# Patient Record
Sex: Male | Born: 1995 | Race: White | Hispanic: No | Marital: Single | Smoking: Former smoker
Health system: Southern US, Community
[De-identification: ages and names within clinical notes are randomized; demographics above are authoritative.]

## PROBLEM LIST (undated history)

## (undated) DIAGNOSIS — Z21 Asymptomatic human immunodeficiency virus [HIV] infection status: Secondary | ICD-10-CM

## (undated) DIAGNOSIS — F419 Anxiety disorder, unspecified: Secondary | ICD-10-CM

## (undated) DIAGNOSIS — B20 Human immunodeficiency virus [HIV] disease: Secondary | ICD-10-CM

## (undated) DIAGNOSIS — J45909 Unspecified asthma, uncomplicated: Secondary | ICD-10-CM

## (undated) DIAGNOSIS — K589 Irritable bowel syndrome without diarrhea: Secondary | ICD-10-CM

---
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
## 2016-04-16 ENCOUNTER — Emergency Department
Admission: EM | Admit: 2016-04-16 | Discharge: 2016-04-16 | Disposition: A | Discharge status: Home / Self Care | Payer: Self-pay | Attending: Emergency Medicine | Admitting: Emergency Medicine

## 2016-04-16 ENCOUNTER — Emergency Department: Payer: Self-pay

## 2016-04-16 DIAGNOSIS — Z87891 Personal history of nicotine dependence: Secondary | ICD-10-CM | POA: Insufficient documentation

## 2016-04-16 DIAGNOSIS — J45909 Unspecified asthma, uncomplicated: Secondary | ICD-10-CM | POA: Insufficient documentation

## 2016-04-16 DIAGNOSIS — Z79899 Other long term (current) drug therapy: Secondary | ICD-10-CM | POA: Insufficient documentation

## 2016-04-16 DIAGNOSIS — R109 Unspecified abdominal pain: Secondary | ICD-10-CM

## 2016-04-16 DIAGNOSIS — K529 Noninfective gastroenteritis and colitis, unspecified: Secondary | ICD-10-CM | POA: Insufficient documentation

## 2016-04-16 DIAGNOSIS — R112 Nausea with vomiting, unspecified: Secondary | ICD-10-CM

## 2016-04-16 LAB — COMPREHENSIVE METABOLIC PANEL
ALBUMIN: 4.4 g/dL (ref 3.5–5.0)
ALK PHOS: 59 U/L (ref 38–126)
ALT: 10 U/L — AB (ref 17–63)
AST: 13 U/L — AB (ref 15–41)
Anion gap: 8 (ref 5–15)
BILIRUBIN TOTAL: 0.7 mg/dL (ref 0.3–1.2)
BUN: 17 mg/dL (ref 6–20)
CALCIUM: 9.1 mg/dL (ref 8.9–10.3)
CO2: 25 mmol/L (ref 22–32)
CREATININE: 0.79 mg/dL (ref 0.61–1.24)
Chloride: 104 mmol/L (ref 101–111)
GFR calc Af Amer: >60 mL/min (ref >60)
GFR calc non Af Amer: >60 mL/min (ref >60)
GLUCOSE: 110 mg/dL — AB (ref 65–99)
POTASSIUM: 3.7 mmol/L (ref 3.5–5.1)
Sodium: 137 mmol/L (ref 135–145)
TOTAL PROTEIN: 8 g/dL (ref 6.5–8.1)

## 2016-04-16 LAB — CBC
HEMATOCRIT: 43.6 % (ref 40.0–52.0)
Hemoglobin: 14.3 g/dL (ref 13.0–18.0)
MCH: 28.2 pg (ref 26.0–34.0)
MCHC: 32.8 g/dL (ref 32.0–36.0)
MCV: 85.8 fL (ref 80.0–100.0)
PLATELETS: 187 10*3/uL (ref 150–440)
RBC: 5.09 MIL/uL (ref 4.40–5.90)
RDW: 12.7 % (ref 11.5–14.5)
WBC: 12.3 10*3/uL — ABNORMAL HIGH (ref 3.8–10.6)

## 2016-04-16 LAB — URINALYSIS, COMPLETE (UACMP) WITH MICROSCOPIC
BACTERIA UA: NONE SEEN
Bilirubin Urine: NEGATIVE
Glucose, UA: NEGATIVE mg/dL
HGB URINE DIPSTICK: NEGATIVE
Ketones, ur: 80 mg/dL — AB
Leukocytes, UA: NEGATIVE
NITRITE: NEGATIVE
PH: 6 (ref 5.0–8.0)
Protein, ur: NEGATIVE mg/dL
RBC / HPF: NONE SEEN RBC/hpf (ref 0–5)
SPECIFIC GRAVITY, URINE: 1.026 (ref 1.005–1.030)
SQUAMOUS EPITHELIAL / LPF: NONE SEEN

## 2016-04-16 LAB — LIPASE, BLOOD: Lipase: 30 U/L (ref 11–51)

## 2016-04-16 MED ORDER — CIPROFLOXACIN HCL 500 MG PO TABS: 500.0000 mg | ORAL_TABLET | Freq: Two times a day (BID) | ORAL | 0 refills | Status: AC

## 2016-04-16 MED ORDER — IOPAMIDOL (ISOVUE-300) INJECTION 61%
15.0000 mL | INTRAVENOUS | Status: AC
  Administered 2016-04-16 (×2): 15 mL via ORAL
  Filled 2016-04-16 (×2): qty 15

## 2016-04-16 MED ORDER — HYDROCODONE-ACETAMINOPHEN 5-325 MG PO TABS: 1.0000 | ORAL_TABLET | ORAL | 0 refills | Status: DC | PRN

## 2016-04-16 MED ORDER — CIPROFLOXACIN HCL 500 MG PO TABS
ORAL_TABLET | ORAL | Status: AC
  Administered 2016-04-16: 500 mg via ORAL
  Filled 2016-04-16: qty 1

## 2016-04-16 MED ORDER — METRONIDAZOLE 500 MG PO TABS
500.0000 mg | ORAL_TABLET | Freq: Once | ORAL | Status: AC
  Administered 2016-04-16: 500 mg via ORAL

## 2016-04-16 MED ORDER — METRONIDAZOLE 500 MG PO TABS
ORAL_TABLET | ORAL | Status: AC
  Filled 2016-04-16: qty 1

## 2016-04-16 MED ORDER — METRONIDAZOLE 500 MG PO TABS: 500.0000 mg | ORAL_TABLET | Freq: Three times a day (TID) | ORAL | 0 refills | Status: AC

## 2016-04-16 MED ORDER — ONDANSETRON HCL 4 MG/2ML IJ SOLN
4.0000 mg | INTRAMUSCULAR | Status: AC
  Administered 2016-04-16: 4 mg via INTRAVENOUS
  Filled 2016-04-16: qty 2

## 2016-04-16 MED ORDER — ONDANSETRON 4 MG PO TBDP: ORAL_TABLET | ORAL | 0 refills | Status: DC

## 2016-04-16 MED ORDER — IOPAMIDOL (ISOVUE-300) INJECTION 61%
75.0000 mL | Freq: Once | INTRAVENOUS | Status: AC | PRN
  Administered 2016-04-16: 75 mL via INTRAVENOUS
  Filled 2016-04-16: qty 75

## 2016-04-16 MED ORDER — MORPHINE SULFATE (PF) 4 MG/ML IV SOLN
4.0000 mg | Freq: Once | INTRAVENOUS | Status: AC
  Administered 2016-04-16: 4 mg via INTRAVENOUS
  Filled 2016-04-16: qty 1

## 2016-04-16 MED ORDER — CIPROFLOXACIN HCL 500 MG PO TABS
500.0000 mg | ORAL_TABLET | ORAL | Status: AC
  Administered 2016-04-16: 500 mg via ORAL

## 2016-04-16 NOTE — ED Notes (Signed)
Given labeled specimen cup. Pt attempted to provide urine sample, unable to do so at this time.

## 2016-04-16 NOTE — ED Provider Notes (Signed)
[facility] Emergency Department Provider Note  ____________________________________________   First MD Initiated Contact with Patient 04/16/16 1723     (approximate)  I have reviewed the triage vital signs and the nursing notes.   HISTORY  Chief Complaint Abdominal Pain and Emesis    HPI Collin Jones is a 21 y.o. male with no significant reported PMH and no surgical history who presents for evaluation of acute onset abd pain w/ multiple episodes of emesis.  Symptoms were severe earlier, "worst pain of my life," but the abd pain is now betterand the nausea has improved.  Reports he has been ill (about a week ago) with flu-like symptoms, but this feels completely different.  He denies chest pain, SOB, diarrhea.  Subjective fever and chills today.  Denies congestion, cough, runny nose.  Pain and nausea are severe, worse with movement, improved slightly on their own.   Past Medical History:  Diagnosis Date  . Asthma     There are no active problems to display for this patient.   History reviewed. No pertinent surgical history.  Prior to Admission medications   Medication Sig Start Date End Date Taking? Authorizing Provider  albuterol (PROVENTIL HFA;VENTOLIN HFA) 108 (90 BASE) MCG/ACT inhaler Inhale 2 puffs into the lungs every 4 (four) hours as needed for wheezing or shortness of breath. 12/24/14   Irean Hong, MD  chlorpheniramine-HYDROcodone (TUSSIONEX PENNKINETIC ER) 10-8 MG/5ML SUER Take 5 mLs by mouth 2 (two) times daily. 12/24/14   Irean Hong, MD  ciprofloxacin (CIPRO) 500 MG tablet Take 1 tablet (500 mg total) by mouth 2 (two) times daily. 04/16/16 04/26/16  Loleta Rose, MD  HYDROcodone-acetaminophen (NORCO/VICODIN) 5-325 MG tablet Take 1-2 tablets by mouth every 4 (four) hours as needed for moderate pain. 04/16/16   Loleta Rose, MD  metroNIDAZOLE (FLAGYL) 500 MG tablet Take 1 tablet (500 mg total) by mouth 3 (three) times daily. 04/16/16 04/26/16   Loleta Rose, MD  ondansetron (ZOFRAN ODT) 4 MG disintegrating tablet Allow 1-2 tablets to dissolve in your mouth every 8 hours as needed for nausea/vomiting 04/16/16   Loleta Rose, MD  predniSONE (DELTASONE) 20 MG tablet 3 tablets daily 4 days 12/24/14   Irean Hong, MD    Allergies Patient has no known allergies.  No family history on file.  Social History Social History  Substance Use Topics  . Smoking status: Former Smoker    Types: Cigarettes  . Smokeless tobacco: Never Used  . Alcohol use No     Comment: rarely    Review of Systems Constitutional: Subjective fever/chills Eyes: No visual changes. ENT: No sore throat. Cardiovascular: Denies chest pain. Respiratory: Denies shortness of breath. Gastrointestinal: Generalized abdominal pain w/ multiple episodes of emesis Genitourinary: Negative for dysuria. Musculoskeletal: Negative for back pain. Skin: Negative for rash. Neurological: Negative for headaches, focal weakness or numbness.  10-point ROS otherwise negative.  ____________________________________________   PHYSICAL EXAM:  VITAL SIGNS: ED Triage Vitals [04/16/16 1401]  Enc Vitals Group     BP 123/65     Pulse Rate 79     Resp 18     Temp 98 F (36.7 C)     Temp Source Oral     SpO2 100 %     Weight 125 lb (56.7 kg)     Height      Head Circumference      Peak Flow      Pain Score 6     Pain Loc  Pain Edu?      Excl. in GC?     Constitutional: Alert and oriented. NAD but appears uncomfortable Eyes: Conjunctivae are normal. PERRL. EOMI. Head: Atraumatic. Nose: No congestion/rhinnorhea. Mouth/Throat: Mucous membranes are moist.  Oropharynx non-erythematous. Neck: No stridor.  No meningeal signs.   Cardiovascular: Normal rate, regular rhythm. Good peripheral circulation. Grossly normal heart sounds. Respiratory: Normal respiratory effort.  No retractions. Lungs CTAB. Gastrointestinal: Soft with localized tenderness to palpation of the RLQ  at McBurney's point with rebound and guarding Musculoskeletal: No lower extremity tenderness nor edema. No gross deformities of extremities. Neurologic:  Normal speech and language. No gross focal neurologic deficits are appreciated.  Skin:  Skin is warm, dry and intact. No rash noted. Psychiatric: Mood and affect are normal. Speech and behavior are normal.  ____________________________________________   LABS (all labs ordered are listed, but only abnormal results are displayed)  Labs Reviewed  COMPREHENSIVE METABOLIC PANEL - Abnormal; Notable for the following:       Result Value   Glucose, Bld 110 (*)    AST 13 (*)    ALT 10 (*)    All other components within normal limits  CBC - Abnormal; Notable for the following:    WBC 12.3 (*)    All other components within normal limits  URINALYSIS, COMPLETE (UACMP) WITH MICROSCOPIC - Abnormal; Notable for the following:    Color, Urine YELLOW (*)    APPearance CLEAR (*)    Ketones, ur 80 (*)    All other components within normal limits  LIPASE, BLOOD   ____________________________________________  EKG  None - EKG not ordered by ED physician ____________________________________________  RADIOLOGY   Ct Abdomen Pelvis W Contrast  Result Date: 04/16/2016 CLINICAL DATA:  Initial evaluation for acute right lower quadrant abdominal pain. EXAM: CT ABDOMEN AND PELVIS WITH CONTRAST TECHNIQUE: Multidetector CT imaging of the abdomen and pelvis was performed using the standard protocol following bolus administration of intravenous contrast. CONTRAST:  75mL ISOVUE-300 IOPAMIDOL (ISOVUE-300) INJECTION 61% COMPARISON:  None. FINDINGS: Lower chest: Few tiny foci of ground-glass opacity within the lingula and posterior left lower lobe, favored to reflect subsegmental atelectasis. Visualized lung bases are otherwise clear. Hepatobiliary: Liver demonstrates a normal contrast enhanced appearance. Gallbladder within normal limits. No biliary dilatation.  Pancreas: Pancreas within normal limits. Spleen: Spleen within normal limits. Adrenals/Urinary Tract: Adrenal glands are normal. Kidneys equal in size with symmetric enhancement. No nephrolithiasis, hydronephrosis, or focal enhancing renal mass. No hydroureter. Bladder within normal limits. Stomach/Bowel: Stomach mildly distended with oral contrast material within the gastric lumen. Stomach otherwise unremarkable. No evidence for bowel obstruction. Appendix visualized within the right lower quadrant and is of normal caliber and appearance associated inflammatory changes to suggest acute appendicitis. No acute inflammatory changes about the adjacent terminal ileum. Colon diffusely decompressed with associated mild circumferential wall thickening. While this is favored to be related incomplete distension, possible mild acute colitis not entirely excluded. No other acute abnormality identified about the bowels. Vascular/Lymphatic: Normal intravascular enhancement seen throughout the intra-abdominal aorta and its branch vessels. No pathologically enlarged intra-abdominal or pelvic lymph nodes identified. Reproductive: Prostate normal. Other: No free air or fluid. Musculoskeletal: No acute osseous abnormality. No worrisome lytic or blastic osseous lesions. IMPRESSION: 1. Diffusely the compression of the descending and sigmoid colon within the left abdomen with associated circumferential wall thickening. While this wall thickening is favored to be related to incomplete distension, possible mild acute colitis is not entirely excluded, and could be considered in the  correct clinical setting. 2. No other acute intra-abdominal or pelvic process identified. 3. Normal appendix. Electronically Signed   By: Rise Mu M.D.   On: 04/16/2016 19:38    ____________________________________________   PROCEDURES  Procedure(s) performed:   Procedures   Critical Care performed:  No ____________________________________________   INITIAL IMPRESSION / ASSESSMENT AND PLAN / ED COURSE  Pertinent labs & imaging results that were available during my care of the patient were reviewed by me and considered in my medical decision making (see chart for details).  Initially I thought that the patient is just suffering from a viral illness which still may be the case.  However on physical exam he has very specific tenderness to palpation of the right lower quadrant with rebound and guarding.  His labs are reassuring as are his vital signs but he and his companion report that he is in the worst pain of his life which is now better.  This is concerning for the possibility of a ruptured appendicitis.  I will evaluate with a CT scan of the abdomen and pelvis.  I am giving morphine and Zofran while he awaits his study.   Clinical Course as of Apr 16 2100  Wed Apr 16, 2016  2050 The patient states he feels quite a bit better.  He has not vomited and is tolerating good by mouth intake.  His CT scan is suggestive of colitis.  I discussed with him that we will treat empirically with Cipro and Flagyl but that he needs to follow up with a GI doctor to determine if additional evaluation is needed to make sure this is not IBD.  I gave my usual customary return precautions and he understands and agrees with the plan.  [CF]    Clinical Course User Index [CF] Loleta Rose, MD    ____________________________________________  FINAL CLINICAL IMPRESSION(S) / ED DIAGNOSES  Final diagnoses:  Colitis  Abdominal pain, unspecified abdominal location  Nausea and vomiting, intractability of vomiting not specified, unspecified vomiting type     MEDICATIONS GIVEN DURING THIS VISIT:  Medications  morphine 4 MG/ML injection 4 mg (4 mg Intravenous Given 04/16/16 1744)  ondansetron (ZOFRAN) injection 4 mg (4 mg Intravenous Given 04/16/16 1744)  iopamidol (ISOVUE-300) 61 % injection 15 mL (15 mLs Oral  Contrast Given 04/16/16 1844)  iopamidol (ISOVUE-300) 61 % injection 75 mL (75 mLs Intravenous Contrast Given 04/16/16 1915)  metroNIDAZOLE (FLAGYL) tablet 500 mg (500 mg Oral Given 04/16/16 2049)  ciprofloxacin (CIPRO) tablet 500 mg (500 mg Oral Given 04/16/16 2049)     NEW OUTPATIENT MEDICATIONS STARTED DURING THIS VISIT:  Discharge Medication List as of 04/16/2016  8:55 PM    START taking these medications   Details  ciprofloxacin (CIPRO) 500 MG tablet Take 1 tablet (500 mg total) by mouth 2 (two) times daily., Starting Wed 04/16/2016, Until Sat 04/26/2016, Print    HYDROcodone-acetaminophen (NORCO/VICODIN) 5-325 MG tablet Take 1-2 tablets by mouth every 4 (four) hours as needed for moderate pain., Starting Wed 04/16/2016, Print    metroNIDAZOLE (FLAGYL) 500 MG tablet Take 1 tablet (500 mg total) by mouth 3 (three) times daily., Starting Wed 04/16/2016, Until Sat 04/26/2016, Print    ondansetron (ZOFRAN ODT) 4 MG disintegrating tablet Allow 1-2 tablets to dissolve in your mouth every 8 hours as needed for nausea/vomiting, Print        Discharge Medication List as of 04/16/2016  8:55 PM      Discharge Medication List as of 04/16/2016  8:55  PM       Note:  This document was prepared using Dragon voice recognition software and may include unintentional dictation errors.    Loleta Rose, MD 04/16/16 2102

## 2016-04-16 NOTE — Discharge Instructions (Signed)
You have been seen in the Emergency Department (ED) for abdominal pain.  Your evaluation did not identify a clear cause of your symptoms but was generally reassuring.  It is possible you have a small degree of colitis, an infection or inflammation of part of your bowel.  We prescribed you two different antibiotics and encourage you to take the full 10-day course of treatment.  Use the Zofran as needed for nausea.  Take Norco as prescribed for severe pain. Do not drink alcohol, drive or participate in any other potentially dangerous activities while taking this medication as it may make you sleepy. Do not take this medication with any other sedating medications, either prescription or over-the-counter. If you were prescribed Percocet or Vicodin, do not take these with acetaminophen (Tylenol) as it is already contained within these medications.   This medication is an opiate (or narcotic) pain medication and can be habit forming.  Use it as little as possible to achieve adequate pain control.  Do not use or use it with extreme caution if you have a history of opiate abuse or dependence.  If you are on a pain contract with your primary care doctor or a pain specialist, be sure to let them know you were prescribed this medication today from the [facility].  This medication is intended for your use only - do not give any to anyone else and keep it in a secure place where nobody else, especially children, have access to it.  It will also cause or worsen constipation, so you may want to consider taking an over-the-counter stool softener while you are taking this medication.  Please follow up as instructed above regarding today?s emergent visit and the symptoms that are bothering you.  Return to the ED if your abdominal pain worsens or fails to improve, you develop bloody vomiting, bloody diarrhea, you are unable to tolerate fluids due to vomiting, fever greater than 101, or other  symptoms that concern you.

## 2016-04-16 NOTE — ED Triage Notes (Signed)
Abdominal pain and NV that began this AM. Emesis with PO fluids. Pt alert and oriented X4, active, cooperative, pt in NAD. RR even and unlabored, color WNL.

## 2016-04-17 ENCOUNTER — Encounter: Payer: Self-pay | Admitting: Gastroenterology

## 2016-04-17 NOTE — Telephone Encounter (Signed)
error 

## 2016-05-06 ENCOUNTER — Ambulatory Visit: Payer: Self-pay | Admitting: Gastroenterology

## 2016-05-29 ENCOUNTER — Ambulatory Visit: Payer: Self-pay | Admitting: Gastroenterology

## 2018-04-11 ENCOUNTER — Other Ambulatory Visit: Payer: Self-pay

## 2018-04-11 DIAGNOSIS — E860 Dehydration: Secondary | ICD-10-CM | POA: Insufficient documentation

## 2018-04-11 DIAGNOSIS — J101 Influenza due to other identified influenza virus with other respiratory manifestations: Secondary | ICD-10-CM | POA: Insufficient documentation

## 2018-04-11 DIAGNOSIS — J45909 Unspecified asthma, uncomplicated: Secondary | ICD-10-CM | POA: Insufficient documentation

## 2018-04-11 DIAGNOSIS — R05 Cough: Secondary | ICD-10-CM | POA: Insufficient documentation

## 2018-04-11 DIAGNOSIS — R52 Pain, unspecified: Secondary | ICD-10-CM | POA: Insufficient documentation

## 2018-04-11 DIAGNOSIS — B20 Human immunodeficiency virus [HIV] disease: Secondary | ICD-10-CM | POA: Insufficient documentation

## 2018-04-11 DIAGNOSIS — Z87891 Personal history of nicotine dependence: Secondary | ICD-10-CM | POA: Insufficient documentation

## 2018-04-11 LAB — URINALYSIS, COMPLETE (UACMP) WITH MICROSCOPIC
BACTERIA UA: NONE SEEN
Bilirubin Urine: NEGATIVE
Glucose, UA: NEGATIVE mg/dL
Hgb urine dipstick: NEGATIVE
KETONES UR: 20 mg/dL — AB
Leukocytes, UA: NEGATIVE
Nitrite: NEGATIVE
PROTEIN: 30 mg/dL — AB
SQUAMOUS EPITHELIAL / LPF: NONE SEEN (ref 0–5)
Specific Gravity, Urine: 1.026 (ref 1.005–1.030)
pH: 5 (ref 5.0–8.0)

## 2018-04-11 LAB — CBC
HEMATOCRIT: 45.9 % (ref 39.0–52.0)
HEMOGLOBIN: 15.7 g/dL (ref 13.0–17.0)
MCH: 29.6 pg (ref 26.0–34.0)
MCHC: 34.2 g/dL (ref 30.0–36.0)
MCV: 86.6 fL (ref 80.0–100.0)
Platelets: 124 10*3/uL — ABNORMAL LOW (ref 150–400)
RBC: 5.3 MIL/uL (ref 4.22–5.81)
RDW: 12.5 % (ref 11.5–15.5)
WBC: 5.2 10*3/uL (ref 4.0–10.5)
nRBC: 0 % (ref 0.0–0.2)

## 2018-04-11 LAB — INFLUENZA PANEL BY PCR (TYPE A & B)
INFLAPCR: NEGATIVE
Influenza B By PCR: POSITIVE — AB

## 2018-04-11 LAB — COMPREHENSIVE METABOLIC PANEL
ALBUMIN: 4.5 g/dL (ref 3.5–5.0)
ALK PHOS: 46 U/L (ref 38–126)
ALT: 23 U/L (ref 0–44)
ANION GAP: 9 (ref 5–15)
AST: 46 U/L — ABNORMAL HIGH (ref 15–41)
BUN: 9 mg/dL (ref 6–20)
CALCIUM: 9.3 mg/dL (ref 8.9–10.3)
CHLORIDE: 104 mmol/L (ref 98–111)
CO2: 24 mmol/L (ref 22–32)
CREATININE: 0.82 mg/dL (ref 0.61–1.24)
GFR calc non Af Amer: >60 mL/min (ref >60)
GLUCOSE: 86 mg/dL (ref 70–99)
Potassium: 3.5 mmol/L (ref 3.5–5.1)
SODIUM: 137 mmol/L (ref 135–145)
Total Bilirubin: 0.7 mg/dL (ref 0.3–1.2)
Total Protein: 7.8 g/dL (ref 6.5–8.1)

## 2018-04-11 LAB — LIPASE, BLOOD: LIPASE: 21 U/L (ref 11–51)

## 2018-04-11 NOTE — ED Triage Notes (Signed)
Patient reports flu like symptoms since last week, lower abdominal pain and body aches.  Also reports that it has been approximately 2 months since he took his HIV medications.

## 2018-04-12 ENCOUNTER — Emergency Department: Payer: Self-pay

## 2018-04-12 ENCOUNTER — Emergency Department
Admission: EM | Admit: 2018-04-12 | Discharge: 2018-04-12 | Disposition: A | Discharge status: Home / Self Care | Payer: Self-pay | Attending: Emergency Medicine | Admitting: Emergency Medicine

## 2018-04-12 DIAGNOSIS — R52 Pain, unspecified: Secondary | ICD-10-CM

## 2018-04-12 DIAGNOSIS — R05 Cough: Secondary | ICD-10-CM

## 2018-04-12 DIAGNOSIS — E860 Dehydration: Secondary | ICD-10-CM

## 2018-04-12 DIAGNOSIS — R059 Cough, unspecified: Secondary | ICD-10-CM

## 2018-04-12 DIAGNOSIS — J111 Influenza due to unidentified influenza virus with other respiratory manifestations: Secondary | ICD-10-CM

## 2018-04-12 LAB — CK: Total CK: 829 U/L — ABNORMAL HIGH (ref 49–397)

## 2018-04-12 MED ORDER — PREDNISONE 20 MG PO TABS
60.0000 mg | ORAL_TABLET | Freq: Once | ORAL | Status: AC
  Administered 2018-04-12: 60 mg via ORAL
  Filled 2018-04-12: qty 3

## 2018-04-12 MED ORDER — IPRATROPIUM-ALBUTEROL 0.5-2.5 (3) MG/3ML IN SOLN
RESPIRATORY_TRACT | Status: AC
  Administered 2018-04-12: 3 mL via RESPIRATORY_TRACT
  Filled 2018-04-12: qty 3

## 2018-04-12 MED ORDER — PREDNISONE 20 MG PO TABS: ORAL_TABLET | ORAL | 0 refills | Status: DC

## 2018-04-12 MED ORDER — ONDANSETRON HCL 4 MG/2ML IJ SOLN
4.0000 mg | Freq: Once | INTRAMUSCULAR | Status: AC
  Administered 2018-04-12: 4 mg via INTRAVENOUS
  Filled 2018-04-12: qty 2

## 2018-04-12 MED ORDER — IPRATROPIUM-ALBUTEROL 0.5-2.5 (3) MG/3ML IN SOLN
3.0000 mL | Freq: Once | RESPIRATORY_TRACT | Status: AC
  Administered 2018-04-12: 3 mL via RESPIRATORY_TRACT
  Filled 2018-04-12: qty 3

## 2018-04-12 MED ORDER — DEXTROSE-NACL 5-0.45 % IV SOLN
Freq: Once | INTRAVENOUS | Status: AC
  Administered 2018-04-12: 01:00:00 via INTRAVENOUS

## 2018-04-12 MED ORDER — KETOROLAC TROMETHAMINE 30 MG/ML IJ SOLN
10.0000 mg | Freq: Once | INTRAMUSCULAR | Status: AC
  Administered 2018-04-12: 9.9 mg via INTRAVENOUS
  Filled 2018-04-12: qty 1

## 2018-04-12 NOTE — ED Notes (Addendum)
Patient transported to XR. 

## 2018-04-12 NOTE — Discharge Instructions (Addendum)
1.  Alternate Tylenol and ibuprofen every 4 hours as needed for fever greater than 100.4 F. 2.  Finish steroid as prescribed (Prednisone 60 mg daily x4 days). 3.  Drink plenty of fluids daily. 4.  Return to the ER for worsening symptoms, persistent vomiting, difficulty breathing or other concerns.

## 2018-04-12 NOTE — ED Provider Notes (Signed)
[facility] Emergency Department Provider Note   ____________________________________________   First MD Initiated Contact with Patient 04/12/18 0037     (approximate)  I have reviewed the triage vital signs and the nursing notes.   HISTORY  Chief Complaint Influenza; Generalized Body Aches; and Emesis    HPI Collin Jones is a 23 y.o. male who presents to the ED from home with a chief complaint of flulike symptoms.  Symptoms started 1.5 to 2 weeks ago.  Patient reports chills, nonproductive cough, muscle aches "to the point that I get angry", left lower abdominal pain from coughing so hard.  Has been taking TheraFlu without relief of symptoms.  No sick contacts.  HIV positive and states he was depressed and not been taking his medicines for the past 2 months.  He has resumed taking his medicines and is seeing a specialist in [place].  Denies fever, chest pain, shortness of breath, abdominal pain, vomiting, dysuria, diarrhea.  Denies recent travel or trauma.    Past Medical History:  Diagnosis Date  . Asthma   HIV  There are no active problems to display for this patient.   No past surgical history on file.  Prior to Admission medications   Medication Collin Start Date End Date Taking? Authorizing Provider  albuterol (PROVENTIL HFA;VENTOLIN HFA) 108 (90 BASE) MCG/ACT inhaler Inhale 2 puffs into the lungs every 4 (four) hours as needed for wheezing or shortness of breath. 12/24/14   Irean Hong, MD  chlorpheniramine-HYDROcodone (TUSSIONEX PENNKINETIC ER) 10-8 MG/5ML SUER Take 5 mLs by mouth 2 (two) times daily. 12/24/14   Irean Hong, MD  HYDROcodone-acetaminophen (NORCO/VICODIN) 5-325 MG tablet Take 1-2 tablets by mouth every 4 (four) hours as needed for moderate pain. 04/16/16   Loleta Rose, MD  ondansetron (ZOFRAN ODT) 4 MG disintegrating tablet Allow 1-2 tablets to dissolve in your mouth every 8 hours as needed for nausea/vomiting 04/16/16   Loleta Rose, MD  predniSONE (DELTASONE) 20 MG tablet 3 tablets PO qd x 4 days 04/12/18   Irean Hong, MD    Allergies Patient has no known allergies.  No family history on file.  Social History Social History   Tobacco Use  . Smoking status: Former Smoker    Types: Cigarettes  . Smokeless tobacco: Never Used  Substance Use Topics  . Alcohol use: No    Comment: rarely  . Drug use: Yes    Types: Marijuana    Comment: monthly use    Review of Systems  Constitutional: Positive for myalgias and chills Eyes: No visual changes. ENT: No sore throat. Cardiovascular: Denies chest pain. Respiratory: Positive for nonproductive cough.  Denies shortness of breath. Gastrointestinal: No abdominal pain.  Positive for nausea, no vomiting.  No diarrhea.  No constipation. Genitourinary: Negative for dysuria. Musculoskeletal: Negative for back pain. Skin: Negative for rash. Neurological: Negative for headaches, focal weakness or numbness.   ____________________________________________   PHYSICAL EXAM:  VITAL SIGNS: ED Triage Vitals  Enc Vitals Group     BP 04/11/18 2139 (!) 133/93     Pulse Rate 04/11/18 2139 78     Resp 04/11/18 2139 16     Temp 04/11/18 2139 98.9 F (37.2 C)     Temp Source 04/11/18 2139 Oral     SpO2 04/11/18 2139 98 %     Weight 04/11/18 2140 130 lb (59 kg)     Height 04/11/18 2140 5\' 4"  (1.626 m)     Head Circumference --  Peak Flow --      Pain Score 04/11/18 2142 4     Pain Loc --      Pain Edu? --      Excl. in GC? --     Constitutional: Alert and oriented. Well appearing and in no acute distress.  Wearing a furry bear costume and slippers.   Eyes: Conjunctivae are normal. PERRL. EOMI. Head: Atraumatic. Nose: Congestion/rhinnorhea. Mouth/Throat: Mucous membranes are moist.  Oropharynx non-erythematous. Neck: No stridor.  Supple neck without meningismus. Hematological/Lymphatic/Immunilogical: No cervical lymphadenopathy. Cardiovascular: Normal  rate, regular rhythm. Grossly normal heart sounds.  Good peripheral circulation. Respiratory: Normal respiratory effort.  No retractions. Lungs CTAB. Gastrointestinal: Soft and nontender to light or deep palpation. No distention. No abdominal bruits. No CVA tenderness. Musculoskeletal: No lower extremity tenderness nor edema.  No joint effusions. Neurologic:  Normal speech and language. No gross focal neurologic deficits are appreciated. No gait instability. Skin:  Skin is warm, dry and intact. No rash noted.  No petechiae. Psychiatric: Mood and affect are normal. Speech and behavior are normal.  ____________________________________________   LABS (all labs ordered are listed, but only abnormal results are displayed)  Labs Reviewed  COMPREHENSIVE METABOLIC PANEL - Abnormal; Notable for the following components:      Result Value   AST 46 (*)    All other components within normal limits  CBC - Abnormal; Notable for the following components:   Platelets 124 (*)    All other components within normal limits  URINALYSIS, COMPLETE (UACMP) WITH MICROSCOPIC - Abnormal; Notable for the following components:   Color, Urine YELLOW (*)    APPearance CLEAR (*)    Ketones, ur 20 (*)    Protein, ur 30 (*)    All other components within normal limits  INFLUENZA PANEL BY PCR (TYPE A & B) - Abnormal; Notable for the following components:   Influenza B By PCR POSITIVE (*)    All other components within normal limits  CK - Abnormal; Notable for the following components:   Total CK 829 (*)    All other components within normal limits  LIPASE, BLOOD   ____________________________________________  EKG  None ____________________________________________  RADIOLOGY  ED MD interpretation: No acute cardiopulmonary process  Official radiology report(s): Dg Chest 2 View  Result Date: 04/12/2018 CLINICAL DATA:  Cough and fever. EXAM: CHEST - 2 VIEW COMPARISON:  None. FINDINGS: The cardiomediastinal  contours are normal. The lungs are clear. Pulmonary vasculature is normal. No consolidation, pleural effusion, or pneumothorax. No acute osseous abnormalities are seen. IMPRESSION: No acute chest findings. Electronically Signed   By: Narda RutherfordMelanie  Sanford M.D.   On: 04/12/2018 01:35    ____________________________________________   PROCEDURES  Procedure(s) performed: None  Procedures  Critical Care performed: No  ____________________________________________   INITIAL IMPRESSION / ASSESSMENT AND PLAN / ED COURSE  As part of my medical decision making, I reviewed the following data within the electronic MEDICAL RECORD NUMBER Nursing notes reviewed and incorporated, Labs reviewed, Old chart reviewed, Radiograph reviewed and Notes from prior ED visits    23 year old male who presents with a 1-1/2 to 2-week history of flulike symptoms.  Laboratory results demonstrate normal WBC, ketones and urinalysis, influenza B positive.  He is out of the window for Tamiflu.  Will check CK given his intense myalgias.  Initiate IV fluid resuscitation, antiemetic, NSAIDs and reassess.  Clinical Course as of Apr 13 419  Mon Apr 12, 2018  0122 Nebulizer given for patient request.  He  is an asthmatic and states he has been hearing himself wheeze with coughing.  We will also add prednisone.   [JS]  0225 Patient feeling significantly better.  Will discharge home on prednisone burst.  Strict return precautions given.  Patient verbalizes understanding and agrees with plan of care.   [JS]    Clinical Course User Index [JS] Irean Hong, MD     ____________________________________________   FINAL CLINICAL IMPRESSION(S) / ED DIAGNOSES  Final diagnoses:  Influenza  Generalized body aches  Cough  Dehydration     ED Discharge Orders         Ordered    predniSONE (DELTASONE) 20 MG tablet     04/12/18 0223           Note:  This document was prepared using Dragon voice recognition software and may  include unintentional dictation errors.    Irean Hong, MD 04/12/18 [phone]

## 2018-04-15 ENCOUNTER — Encounter: Payer: Self-pay | Admitting: Emergency Medicine

## 2018-04-15 ENCOUNTER — Emergency Department
Admission: EM | Admit: 2018-04-15 | Discharge: 2018-04-15 | Disposition: A | Discharge status: Home / Self Care | Payer: Self-pay | Attending: Emergency Medicine | Admitting: Emergency Medicine

## 2018-04-15 ENCOUNTER — Other Ambulatory Visit: Payer: Self-pay

## 2018-04-15 DIAGNOSIS — R05 Cough: Secondary | ICD-10-CM | POA: Insufficient documentation

## 2018-04-15 DIAGNOSIS — Z5321 Procedure and treatment not carried out due to patient leaving prior to being seen by health care provider: Secondary | ICD-10-CM | POA: Insufficient documentation

## 2018-04-15 NOTE — ED Triage Notes (Addendum)
Patient ambulatory to triage with steady gait, without difficulty or distress noted, mask in place (dx with flu recently); st was drinking some alcohol and got choked and coughed up some blood which made him anxious

## 2018-05-19 ENCOUNTER — Emergency Department
Admission: EM | Admit: 2018-05-19 | Discharge: 2018-05-19 | Disposition: A | Discharge status: Home / Self Care | Payer: Self-pay | Attending: Emergency Medicine | Admitting: Emergency Medicine

## 2018-05-19 ENCOUNTER — Other Ambulatory Visit: Payer: Self-pay

## 2018-05-19 ENCOUNTER — Encounter: Payer: Self-pay | Admitting: Emergency Medicine

## 2018-05-19 DIAGNOSIS — F17210 Nicotine dependence, cigarettes, uncomplicated: Secondary | ICD-10-CM | POA: Insufficient documentation

## 2018-05-19 DIAGNOSIS — F1210 Cannabis abuse, uncomplicated: Secondary | ICD-10-CM | POA: Insufficient documentation

## 2018-05-19 DIAGNOSIS — R1032 Left lower quadrant pain: Secondary | ICD-10-CM | POA: Insufficient documentation

## 2018-05-19 DIAGNOSIS — J45909 Unspecified asthma, uncomplicated: Secondary | ICD-10-CM | POA: Insufficient documentation

## 2018-05-19 DIAGNOSIS — B20 Human immunodeficiency virus [HIV] disease: Secondary | ICD-10-CM | POA: Insufficient documentation

## 2018-05-19 HISTORY — DX: Asymptomatic human immunodeficiency virus (hiv) infection status: Z21

## 2018-05-19 HISTORY — DX: Human immunodeficiency virus (HIV) disease: B20

## 2018-05-19 LAB — COMPREHENSIVE METABOLIC PANEL
ALBUMIN: 4.5 g/dL (ref 3.5–5.0)
ALK PHOS: 63 U/L (ref 38–126)
ALT: 12 U/L (ref 0–44)
AST: 12 U/L — ABNORMAL LOW (ref 15–41)
Anion gap: 9 (ref 5–15)
BILIRUBIN TOTAL: 0.4 mg/dL (ref 0.3–1.2)
BUN: 14 mg/dL (ref 6–20)
CALCIUM: 9.4 mg/dL (ref 8.9–10.3)
CO2: 25 mmol/L (ref 22–32)
CREATININE: 0.85 mg/dL (ref 0.61–1.24)
Chloride: 104 mmol/L (ref 98–111)
GFR calc Af Amer: >60 mL/min (ref >60)
GFR calc non Af Amer: >60 mL/min (ref >60)
Glucose, Bld: 100 mg/dL — ABNORMAL HIGH (ref 70–99)
Potassium: 4 mmol/L (ref 3.5–5.1)
SODIUM: 138 mmol/L (ref 135–145)
TOTAL PROTEIN: 7.1 g/dL (ref 6.5–8.1)

## 2018-05-19 LAB — URINALYSIS, COMPLETE (UACMP) WITH MICROSCOPIC
Bacteria, UA: NONE SEEN
Bilirubin Urine: NEGATIVE
Glucose, UA: NEGATIVE mg/dL
HGB URINE DIPSTICK: NEGATIVE
Ketones, ur: NEGATIVE mg/dL
Leukocytes,Ua: NEGATIVE
NITRITE: NEGATIVE
PROTEIN: NEGATIVE mg/dL
Specific Gravity, Urine: 1.028 (ref 1.005–1.030)
pH: 6 (ref 5.0–8.0)

## 2018-05-19 LAB — CBC WITH DIFFERENTIAL/PLATELET
ABS IMMATURE GRANULOCYTES: 0.04 10*3/uL (ref 0.00–0.07)
BASOS ABS: 0 10*3/uL (ref 0.0–0.1)
Basophils Relative: 0 %
EOS PCT: 7 %
Eosinophils Absolute: 0.7 10*3/uL — ABNORMAL HIGH (ref 0.0–0.5)
HEMATOCRIT: 42.6 % (ref 39.0–52.0)
HEMOGLOBIN: 14.3 g/dL (ref 13.0–17.0)
Immature Granulocytes: 0 %
LYMPHS ABS: 2.5 10*3/uL (ref 0.7–4.0)
LYMPHS PCT: 25 %
MCH: 30.4 pg (ref 26.0–34.0)
MCHC: 33.6 g/dL (ref 30.0–36.0)
MCV: 90.4 fL (ref 80.0–100.0)
MONO ABS: 0.9 10*3/uL (ref 0.1–1.0)
MONOS PCT: 9 %
NRBC: 0 % (ref 0.0–0.2)
Neutro Abs: 6.1 10*3/uL (ref 1.7–7.7)
Neutrophils Relative %: 59 %
Platelets: 85 10*3/uL — ABNORMAL LOW (ref 150–400)
RBC: 4.71 MIL/uL (ref 4.22–5.81)
RDW: 13.3 % (ref 11.5–15.5)
WBC: 10.3 10*3/uL (ref 4.0–10.5)

## 2018-05-19 LAB — LIPASE, BLOOD: Lipase: 25 U/L (ref 11–51)

## 2018-05-19 MED ORDER — DICYCLOMINE HCL 20 MG PO TABS: 20.0000 mg | ORAL_TABLET | Freq: Three times a day (TID) | ORAL | 0 refills | Status: DC | PRN

## 2018-05-19 MED ORDER — DOCUSATE SODIUM 100 MG PO CAPS: 100.0000 mg | ORAL_CAPSULE | Freq: Every day | ORAL | 0 refills | Status: AC

## 2018-05-19 NOTE — ED Provider Notes (Signed)
[facility] Emergency Department Provider Note  Time seen: 9:17 PM  I have reviewed the triage vital signs and the nursing notes.   HISTORY  Chief Complaint Abdominal Pain    HPI Collin Jones is a 23 y.o. male past medical history of HIV, asthma, presents to the emergency department for left lower quadrant abdominal pain.  According to the patient for the past several days he has been experiencing intermittent pain in his left lower quadrant.  Patient states he feels like he is constipated or like a pulled muscle.  Denies any significant pain currently.  States he is very anxious about his health, states he had to leave work tonight because he wanted to get this checked out.  Denies any fever, nausea, vomiting, diarrhea.  Patient does state he is HIV positive and has not been taking his medications but is currently trying to get back in with the clinic.   Past Medical History:  Diagnosis Date  . Asthma   . HIV (human immunodeficiency virus infection) (HCC)     There are no active problems to display for this patient.   History reviewed. No pertinent surgical history.  Prior to Admission medications   Medication Sig Start Date End Date Taking? Authorizing Provider  albuterol (PROVENTIL HFA;VENTOLIN HFA) 108 (90 BASE) MCG/ACT inhaler Inhale 2 puffs into the lungs every 4 (four) hours as needed for wheezing or shortness of breath. 12/24/14   Irean Hong, MD  chlorpheniramine-HYDROcodone (TUSSIONEX PENNKINETIC ER) 10-8 MG/5ML SUER Take 5 mLs by mouth 2 (two) times daily. 12/24/14   Irean Hong, MD  HYDROcodone-acetaminophen (NORCO/VICODIN) 5-325 MG tablet Take 1-2 tablets by mouth every 4 (four) hours as needed for moderate pain. 04/16/16   Loleta Rose, MD  ondansetron (ZOFRAN ODT) 4 MG disintegrating tablet Allow 1-2 tablets to dissolve in your mouth every 8 hours as needed for nausea/vomiting 04/16/16   Loleta Rose, MD  predniSONE (DELTASONE) 20 MG tablet 3  tablets PO qd x 4 days 04/12/18   Irean Hong, MD    No Known Allergies  No family history on file.  Social History Social History   Tobacco Use  . Smoking status: Current Some Day Smoker    Types: Cigarettes  . Smokeless tobacco: Never Used  Substance Use Topics  . Alcohol use: No    Comment: rarely  . Drug use: Yes    Types: Marijuana    Comment: monthly use    Review of Systems Constitutional: Negative for fever Cardiovascular: Negative for chest pain. Respiratory: Negative for shortness of breath. Gastrointestinal: Mild intermittent left lower quadrant abdominal pain.  Negative for nausea vomiting diarrhea. Genitourinary: Negative for urinary compaints Musculoskeletal: Negative for musculoskeletal complaints Skin: Negative for skin complaints  Neurological: Negative for headache All other ROS negative  ____________________________________________   PHYSICAL EXAM:  VITAL SIGNS: ED Triage Vitals [05/19/18 2029]  Enc Vitals Group     BP 131/79     Pulse Rate 77     Resp 18     Temp 98.3 F (36.8 C)     Temp Source Oral     SpO2 98 %     Weight 135 lb (61.2 kg)     Height 5\' 4"  (1.626 m)     Head Circumference      Peak Flow      Pain Score 8     Pain Loc      Pain Edu?      Excl. in  GC?    Constitutional: Alert and oriented. Well appearing and in no distress. Eyes: Normal exam ENT   Head: Normocephalic and atraumatic.   Mouth/Throat: Mucous membranes are moist. Cardiovascular: Normal rate, regular rhythm Respiratory: Normal respiratory effort without tachypnea nor retractions. Breath sounds are clear Gastrointestinal: Soft and nontender. No distention.   Musculoskeletal: Nontender with normal range of motion in all extremities. Neurologic:  Normal speech and language. No gross focal neurologic deficits  Skin:  Skin is warm, dry and intact.  Psychiatric: Mood and affect are normal.  ____________________________________________   INITIAL  IMPRESSION / ASSESSMENT AND PLAN / ED COURSE  Pertinent labs & imaging results that were available during my care of the patient were reviewed by me and considered in my medical decision making (see chart for details).  Patient presents to the emergency department with intermittent left lower quadrant abdominal discomfort, states it feels like a pulled muscle or constipation.  Overall the patient appears very well, completely benign abdominal exam without any tenderness elicited.  Patient's labs are largely within normal limits.  Urine is clean.  I discussed with the patient the need to follow-up with the clinic to get back on his HIV medications.  Patient understands and is actively pursuing this.  Currently the patient appears well with no acute distress with a reassuring exam I believe the patient is safe for discharge home.  We will discharge with a short course of Bentyl as well as Colace and have the patient follow-up with his PCP.  ____________________________________________   FINAL CLINICAL IMPRESSION(S) / ED DIAGNOSES  Abdominal pain   Minna Antis, MD 05/19/18 2120

## 2018-05-19 NOTE — ED Triage Notes (Signed)
Patient ambulatory to triage with steady gait, without difficulty or distress noted; pt reports intermittent left lower abd pain x 2 days with no accomp symptoms; st "feels like a pulled muscle"

## 2018-06-23 ENCOUNTER — Other Ambulatory Visit: Payer: Self-pay

## 2018-06-23 ENCOUNTER — Encounter: Payer: Self-pay | Admitting: Emergency Medicine

## 2018-06-23 ENCOUNTER — Emergency Department
Admission: EM | Admit: 2018-06-23 | Discharge: 2018-06-24 | Disposition: A | Discharge status: Home / Self Care | Payer: Self-pay | Attending: Emergency Medicine | Admitting: Emergency Medicine

## 2018-06-23 DIAGNOSIS — Z79899 Other long term (current) drug therapy: Secondary | ICD-10-CM | POA: Insufficient documentation

## 2018-06-23 DIAGNOSIS — J45909 Unspecified asthma, uncomplicated: Secondary | ICD-10-CM | POA: Insufficient documentation

## 2018-06-23 DIAGNOSIS — K581 Irritable bowel syndrome with constipation: Secondary | ICD-10-CM | POA: Insufficient documentation

## 2018-06-23 DIAGNOSIS — Z21 Asymptomatic human immunodeficiency virus [HIV] infection status: Secondary | ICD-10-CM | POA: Insufficient documentation

## 2018-06-23 DIAGNOSIS — K219 Gastro-esophageal reflux disease without esophagitis: Secondary | ICD-10-CM | POA: Insufficient documentation

## 2018-06-23 DIAGNOSIS — F17210 Nicotine dependence, cigarettes, uncomplicated: Secondary | ICD-10-CM | POA: Insufficient documentation

## 2018-06-23 HISTORY — DX: Irritable bowel syndrome, unspecified: K58.9

## 2018-06-23 LAB — TROPONIN I: Troponin I: <0.03 ng/mL (ref <0.03)

## 2018-06-23 LAB — URINALYSIS, COMPLETE (UACMP) WITH MICROSCOPIC
Bacteria, UA: NONE SEEN
Bilirubin Urine: NEGATIVE
Glucose, UA: NEGATIVE mg/dL
Hgb urine dipstick: NEGATIVE
Ketones, ur: NEGATIVE mg/dL
Leukocytes,Ua: NEGATIVE
Nitrite: NEGATIVE
Protein, ur: NEGATIVE mg/dL
Specific Gravity, Urine: 1.013 (ref 1.005–1.030)
pH: 7 (ref 5.0–8.0)

## 2018-06-23 LAB — COMPREHENSIVE METABOLIC PANEL
ALT: 13 U/L (ref 0–44)
AST: 15 U/L (ref 15–41)
Albumin: 4.8 g/dL (ref 3.5–5.0)
Alkaline Phosphatase: 63 U/L (ref 38–126)
Anion gap: 12 (ref 5–15)
BUN: 8 mg/dL (ref 6–20)
CO2: 24 mmol/L (ref 22–32)
Calcium: 9.9 mg/dL (ref 8.9–10.3)
Chloride: 105 mmol/L (ref 98–111)
Creatinine, Ser: 0.74 mg/dL (ref 0.61–1.24)
GFR calc Af Amer: >60 mL/min (ref >60)
GFR calc non Af Amer: >60 mL/min (ref >60)
Glucose, Bld: 98 mg/dL (ref 70–99)
Potassium: 3.2 mmol/L — ABNORMAL LOW (ref 3.5–5.1)
Sodium: 141 mmol/L (ref 135–145)
Total Bilirubin: 0.9 mg/dL (ref 0.3–1.2)
Total Protein: 7.7 g/dL (ref 6.5–8.1)

## 2018-06-23 LAB — CBC
HCT: 46 % (ref 39.0–52.0)
Hemoglobin: 15.6 g/dL (ref 13.0–17.0)
MCH: 29.9 pg (ref 26.0–34.0)
MCHC: 33.9 g/dL (ref 30.0–36.0)
MCV: 88.3 fL (ref 80.0–100.0)
Platelets: 104 10*3/uL — ABNORMAL LOW (ref 150–400)
RBC: 5.21 MIL/uL (ref 4.22–5.81)
RDW: 13.2 % (ref 11.5–15.5)
WBC: 9.3 10*3/uL (ref 4.0–10.5)
nRBC: 0 % (ref 0.0–0.2)

## 2018-06-23 LAB — LIPASE, BLOOD: Lipase: 23 U/L (ref 11–51)

## 2018-06-23 MED ORDER — DICYCLOMINE HCL 20 MG PO TABS: 20.0000 mg | ORAL_TABLET | Freq: Three times a day (TID) | ORAL | 0 refills | Status: DC | PRN

## 2018-06-23 MED ORDER — PANTOPRAZOLE SODIUM 20 MG PO TBEC: 20.0000 mg | DELAYED_RELEASE_TABLET | Freq: Every day | ORAL | 0 refills | Status: DC

## 2018-06-23 MED ORDER — LORAZEPAM 0.5 MG PO TABS
0.5000 mg | ORAL_TABLET | Freq: Once | ORAL | Status: AC
  Administered 2018-06-23: 0.5 mg via ORAL
  Filled 2018-06-23: qty 1

## 2018-06-23 NOTE — ED Notes (Signed)
Pt stated that he has been anxious and he stated that he does not take anything for anxiety. Pt also has lower RQ pain that happened once yesterday and once today.

## 2018-06-23 NOTE — ED Provider Notes (Signed)
[facility] Emergency Department Provider Note    First MD Initiated Contact with Patient 06/23/18 2323     (approximate)  I have reviewed the triage vital signs and the nursing notes.   HISTORY  Chief Complaint Chest Pain   HPI Collin Jones is a 23 y.o. male with medical history as listed below with history of HIV and irritable bowel syndrome presents to the emergency department multiple medical complaints including chest tightness that the patient describes as feeling like gas, abdominal cramping feelings of anxiety/panic attack.  Patient states "I am very stressed right now because of not working".  Patient states that he has been off his HIV antiviral medication x1 year.         Past Medical History:  Diagnosis Date  . Asthma   . HIV (human immunodeficiency virus infection) (HCC)   . IBS (irritable bowel syndrome)     There are no active problems to display for this patient.   No past surgical history on file.  Prior to Admission medications   Medication Sig Start Date End Date Taking? Authorizing Provider  albuterol (PROVENTIL HFA;VENTOLIN HFA) 108 (90 BASE) MCG/ACT inhaler Inhale 2 puffs into the lungs every 4 (four) hours as needed for wheezing or shortness of breath. 12/24/14   Irean Hong, MD  chlorpheniramine-HYDROcodone (TUSSIONEX PENNKINETIC ER) 10-8 MG/5ML SUER Take 5 mLs by mouth 2 (two) times daily. 12/24/14   Irean Hong, MD  dicyclomine (BENTYL) 20 MG tablet Take 1 tablet (20 mg total) by mouth 3 (three) times daily as needed for spasms. 05/19/18 05/19/19  Minna Antis, MD  HYDROcodone-acetaminophen (NORCO/VICODIN) 5-325 MG tablet Take 1-2 tablets by mouth every 4 (four) hours as needed for moderate pain. 04/16/16   Loleta Rose, MD  ondansetron (ZOFRAN ODT) 4 MG disintegrating tablet Allow 1-2 tablets to dissolve in your mouth every 8 hours as needed for nausea/vomiting 04/16/16   Loleta Rose, MD  predniSONE (DELTASONE) 20 MG  tablet 3 tablets PO qd x 4 days 04/12/18   Irean Hong, MD    Allergies Patient has no known allergies.  No family history on file.  Social History Social History   Tobacco Use  . Smoking status: Current Some Day Smoker    Types: Cigarettes  . Smokeless tobacco: Never Used  Substance Use Topics  . Alcohol use: No    Comment: rarely  . Drug use: Yes    Types: Marijuana    Comment: monthly use    Review of Systems Constitutional: No fever/chills Eyes: No visual changes. ENT: No sore throat. Cardiovascular: Positive for chest pain Respiratory: Denies shortness of breath. Gastrointestinal: No abdominal pain.  No nausea, no vomiting.  No diarrhea.  No constipation. Genitourinary: Negative for dysuria. Musculoskeletal: Negative for neck pain.  Negative for back pain. Integumentary: Negative for rash. Neurological: Negative for headaches, focal weakness or numbness. Psychiatric:  Positive for anxiety   ____________________________________________   PHYSICAL EXAM:  VITAL SIGNS: ED Triage Vitals [06/23/18 2135]  Enc Vitals Group     BP (!) 141/87     Pulse Rate (!) 112     Resp 20     Temp 99.4 F (37.4 C)     Temp Source Oral     SpO2 100 %     Weight 61.2 kg (135 lb)     Height 1.575 m (5\' 2" )     Head Circumference      Peak Flow  Pain Score 0     Pain Loc      Pain Edu?      Excl. in GC?     Constitutional: Alert and oriented.  Anxious affect  eyes: Conjunctivae are normal. PERRL. EOMI. Mouth/Throat: Mucous membranes are moist. Oropharynx non-erythematous. Neck: No stridor.  Cardiovascular: Normal rate, regular rhythm. Good peripheral circulation. Grossly normal heart sounds. Respiratory: Normal respiratory effort.  No retractions. Lungs CTAB. Gastrointestinal: Soft and nontender. No distention.  Musculoskeletal: No lower extremity tenderness nor edema. No gross deformities of extremities. Neurologic:  Normal speech and language. No gross focal  neurologic deficits are appreciated.  Skin:  Skin is warm, dry and intact. No rash noted. Psychiatric: Mood and affect are normal. Speech and behavior are normal.  ____________________________________________   LABS (all labs ordered are listed, but only abnormal results are displayed)  Labs Reviewed  CBC - Abnormal; Notable for the following components:      Result Value   Platelets 104 (*)    All other components within normal limits  COMPREHENSIVE METABOLIC PANEL - Abnormal; Notable for the following components:   Potassium 3.2 (*)    All other components within normal limits  URINALYSIS, COMPLETE (UACMP) WITH MICROSCOPIC - Abnormal; Notable for the following components:   Color, Urine YELLOW (*)    APPearance HAZY (*)    All other components within normal limits  LIPASE, BLOOD  TROPONIN I   ____________________________________________  EKG  ED ECG REPORT I, [place] N BROWN, the attending physician, personally viewed and interpreted this ECG.   Date: 06/23/2018  EKG Time: 1:03 PM  Rate: 103  Rhythm: Normal sinus rhythm  Axis: Normal  Intervals:Normal  ST&T Change: None    Procedures   ____________________________________________   INITIAL IMPRESSION / MDM / ASSESSMENT AND PLAN / ED COURSE  As part of my medical decision making, I reviewed the following data within the electronic MEDICAL RECORD NUMBER  Collin Jones was evaluated in Emergency Department on 06/23/2018 for the symptoms described in the history of present illness. He was evaluated in the context of the global COVID-19 pandemic, which necessitated consideration that the patient might be at risk for infection with the SARS-CoV-2 virus that causes COVID-19. Institutional protocols and algorithms that pertain to the evaluation of patients at risk for COVID-19 are in a state of rapid change based on information released by regulatory bodies including the CDC and federal and state organizations. These policies  and algorithms were followed during the patient's care in the ED.     23 year old male presenting with above-stated history and physical exam consistent with panic attack.  However given patient's report of chest discomfort EKG was performed which revealed no evidence of ischemia or infarction.  Laboratory data likewise unremarkable with the exception of a potassium of 3.2.  Patient's abdominal discomfort which was not reproducible on exam most likely secondary to patient's known history of IBS for which patient was prescribed Bentyl and pantoprazole for known history of GERD.    ____________________________________________  FINAL CLINICAL IMPRESSION(S) / ED DIAGNOSES  Final diagnoses:  Irritable bowel syndrome with constipation  Gastroesophageal reflux disease, esophagitis presence not specified     MEDICATIONS GIVEN DURING THIS VISIT:  Medications  LORazepam (ATIVAN) tablet 0.5 mg (has no administration in time range)     ED Discharge Orders    None       Note:  This document was prepared using Dragon voice recognition software and may include unintentional dictation errors.  Darci CurrentBrown, Enlow N, MD 06/24/18 [phone]

## 2018-06-23 NOTE — ED Triage Notes (Addendum)
Pt in with co chest wall pain that is worse when he moves and take a deep breath. States has also some chest burning, hx of IBS. Also co abd pain, states feeling anxious. PT also has hx of panic attacks. Pt states symptoms started 3 weeks ago.

## 2018-06-25 ENCOUNTER — Emergency Department
Admission: EM | Admit: 2018-06-25 | Discharge: 2018-06-26 | Disposition: A | Discharge status: Home / Self Care | Payer: Self-pay | Attending: Emergency Medicine | Admitting: Emergency Medicine

## 2018-06-25 ENCOUNTER — Other Ambulatory Visit: Payer: Self-pay

## 2018-06-25 ENCOUNTER — Emergency Department: Payer: Self-pay

## 2018-06-25 DIAGNOSIS — F17210 Nicotine dependence, cigarettes, uncomplicated: Secondary | ICD-10-CM | POA: Insufficient documentation

## 2018-06-25 DIAGNOSIS — Z79899 Other long term (current) drug therapy: Secondary | ICD-10-CM | POA: Insufficient documentation

## 2018-06-25 DIAGNOSIS — Z21 Asymptomatic human immunodeficiency virus [HIV] infection status: Secondary | ICD-10-CM | POA: Insufficient documentation

## 2018-06-25 DIAGNOSIS — R1031 Right lower quadrant pain: Secondary | ICD-10-CM | POA: Insufficient documentation

## 2018-06-25 DIAGNOSIS — J45909 Unspecified asthma, uncomplicated: Secondary | ICD-10-CM | POA: Insufficient documentation

## 2018-06-25 LAB — CBC WITH DIFFERENTIAL/PLATELET
Abs Immature Granulocytes: 0.02 10*3/uL (ref 0.00–0.07)
Basophils Absolute: 0 10*3/uL (ref 0.0–0.1)
Basophils Relative: 1 %
Eosinophils Absolute: 0.2 10*3/uL (ref 0.0–0.5)
Eosinophils Relative: 4 %
HCT: 46.3 % (ref 39.0–52.0)
Hemoglobin: 15.8 g/dL (ref 13.0–17.0)
Immature Granulocytes: 0 %
Lymphocytes Relative: 32 %
Lymphs Abs: 2.1 10*3/uL (ref 0.7–4.0)
MCH: 29.8 pg (ref 26.0–34.0)
MCHC: 34.1 g/dL (ref 30.0–36.0)
MCV: 87.4 fL (ref 80.0–100.0)
Monocytes Absolute: 0.6 10*3/uL (ref 0.1–1.0)
Monocytes Relative: 9 %
Neutro Abs: 3.6 10*3/uL (ref 1.7–7.7)
Neutrophils Relative %: 54 %
Platelets: 93 10*3/uL — ABNORMAL LOW (ref 150–400)
RBC: 5.3 MIL/uL (ref 4.22–5.81)
RDW: 13.2 % (ref 11.5–15.5)
WBC: 6.6 10*3/uL (ref 4.0–10.5)
nRBC: 0 % (ref 0.0–0.2)

## 2018-06-25 LAB — COMPREHENSIVE METABOLIC PANEL
ALT: 13 U/L (ref 0–44)
AST: 13 U/L — ABNORMAL LOW (ref 15–41)
Albumin: 5.1 g/dL — ABNORMAL HIGH (ref 3.5–5.0)
Alkaline Phosphatase: 57 U/L (ref 38–126)
Anion gap: 12 (ref 5–15)
BUN: 8 mg/dL (ref 6–20)
CO2: 24 mmol/L (ref 22–32)
Calcium: 9.8 mg/dL (ref 8.9–10.3)
Chloride: 104 mmol/L (ref 98–111)
Creatinine, Ser: 0.79 mg/dL (ref 0.61–1.24)
GFR calc Af Amer: >60 mL/min (ref >60)
GFR calc non Af Amer: >60 mL/min (ref >60)
Glucose, Bld: 80 mg/dL (ref 70–99)
Potassium: 3.1 mmol/L — ABNORMAL LOW (ref 3.5–5.1)
Sodium: 140 mmol/L (ref 135–145)
Total Bilirubin: 1.4 mg/dL — ABNORMAL HIGH (ref 0.3–1.2)
Total Protein: 7.7 g/dL (ref 6.5–8.1)

## 2018-06-25 LAB — LIPASE, BLOOD: Lipase: 22 U/L (ref 11–51)

## 2018-06-25 MED ORDER — IOHEXOL 240 MG/ML SOLN
50.0000 mL | Freq: Once | INTRAMUSCULAR | Status: AC | PRN
  Administered 2018-06-25: 50 mL via ORAL

## 2018-06-25 MED ORDER — FAMOTIDINE 20 MG PO TABS
20.0000 mg | ORAL_TABLET | Freq: Once | ORAL | Status: AC
  Administered 2018-06-25: 20 mg via ORAL
  Filled 2018-06-25: qty 1

## 2018-06-25 MED ORDER — SODIUM CHLORIDE 0.9 % IV BOLUS
1000.0000 mL | Freq: Once | INTRAVENOUS | Status: AC
  Administered 2018-06-25: 1000 mL via INTRAVENOUS

## 2018-06-25 MED ORDER — IOHEXOL 300 MG/ML  SOLN
100.0000 mL | Freq: Once | INTRAMUSCULAR | Status: AC | PRN
  Administered 2018-06-25: 100 mL via INTRAVENOUS

## 2018-06-25 MED ORDER — ALUM & MAG HYDROXIDE-SIMETH 200-200-20 MG/5ML PO SUSP
15.0000 mL | Freq: Once | ORAL | Status: AC
  Administered 2018-06-25: 15 mL via ORAL
  Filled 2018-06-25: qty 30

## 2018-06-25 NOTE — ED Triage Notes (Signed)
Patient reports having abdominal pain.  Reports seen Wednesday for the same and it seems worse.

## 2018-06-25 NOTE — ED Provider Notes (Signed)
[facility] Emergency Department Provider Note   ____________________________________________   First MD Initiated Contact with Patient 06/25/18 2205     (approximate)  I have reviewed the triage vital signs and the nursing notes.   HISTORY  Chief Complaint Abdominal Pain    HPI Collin Jones is a 23 y.o. male here for evaluation of abdominal pain  Patient comes today reports that he is nervous because he is been having abdominal pain for about 3 days now.  Is associated with some occasional belching, but it is located now mostly in his lower right abdomen.  He is not had any vomiting.  No nausea.  Reports burning feeling in his mid upper abdomen at times.  He took Tums and it helps for a little bit and gets worse.  Not having any diarrhea.  Denies any travel history.  He has not noticed any fevers.  Seems to be similar to what it was on Wednesday but seems to be slowly getting worse.  Additionally, patient tells me he has been off his HIV medicine for a month now, but has an appointment with infectious disease on Monday [facility]   Past Medical History:  Diagnosis Date  . Asthma   . HIV (human immunodeficiency virus infection) (HCC)   . IBS (irritable bowel syndrome)     There are no active problems to display for this patient.   No past surgical history on file.  Prior to Admission medications   Medication Sig Start Date End Date Taking? Authorizing Provider  albuterol (PROVENTIL HFA;VENTOLIN HFA) 108 (90 BASE) MCG/ACT inhaler Inhale 2 puffs into the lungs every 4 (four) hours as needed for wheezing or shortness of breath. 12/24/14   Irean HongSung, Jade J, MD  chlorpheniramine-HYDROcodone (TUSSIONEX PENNKINETIC ER) 10-8 MG/5ML SUER Take 5 mLs by mouth 2 (two) times daily. 12/24/14   Irean HongSung, Jade J, MD  dicyclomine (BENTYL) 20 MG tablet Take 1 tablet (20 mg total) by mouth 3 (three) times daily as needed for spasms. 05/19/18 05/19/19   Minna AntisPaduchowski, Kevin, MD  dicyclomine (BENTYL) 20 MG tablet Take 1 tablet (20 mg total) by mouth 3 (three) times daily as needed for spasms. 06/23/18 06/23/19  Darci CurrentBrown,  N, MD  HYDROcodone-acetaminophen (NORCO/VICODIN) 5-325 MG tablet Take 1-2 tablets by mouth every 4 (four) hours as needed for moderate pain. 04/16/16   Loleta RoseForbach, Cory, MD  ondansetron (ZOFRAN ODT) 4 MG disintegrating tablet Allow 1-2 tablets to dissolve in your mouth every 8 hours as needed for nausea/vomiting 04/16/16   Loleta RoseForbach, Cory, MD  pantoprazole (PROTONIX) 20 MG tablet Take 1 tablet (20 mg total) by mouth daily. 06/23/18   Darci CurrentBrown,  N, MD  predniSONE (DELTASONE) 20 MG tablet 3 tablets PO qd x 4 days 04/12/18   Irean HongSung, Jade J, MD    Allergies Patient has no known allergies.  No family history on file.  Social History Social History   Tobacco Use  . Smoking status: Current Some Day Smoker    Types: Cigarettes  . Smokeless tobacco: Never Used  Substance Use Topics  . Alcohol use: No    Comment: rarely  . Drug use: Yes    Types: Marijuana    Comment: monthly use    Review of Systems Constitutional: No fever/chills Eyes: No visual changes. ENT: No sore throat. Cardiovascular: Denies chest pain. Respiratory: Denies shortness of breath. Gastrointestinal: See HPI Genitourinary: Negative for dysuria.  No testicular or penile pain.  Denies pain in the  groin. Musculoskeletal: Negative for back pain. Skin: Negative for rash. Neurological: Negative for headaches, areas of focal weakness or numbness.    ____________________________________________   PHYSICAL EXAM:  VITAL SIGNS: ED Triage Vitals  Enc Vitals Group     BP 06/25/18 2201 132/80     Pulse Rate 06/25/18 2201 87     Resp 06/25/18 2201 18     Temp 06/25/18 2201 99.1 F (37.3 C)     Temp Source 06/25/18 2201 Oral     SpO2 06/25/18 2201 99 %     Weight --      Height --      Head Circumference --      Peak Flow --      Pain Score 06/25/18  2200 6     Pain Loc --      Pain Edu? --      Excl. in GC? --     Constitutional: Alert and oriented. Well appearing and in no acute distress. Eyes: Conjunctivae are normal. Head: Atraumatic. Nose: No congestion/rhinnorhea. Mouth/Throat: Mucous membranes are moist. Neck: No stridor.  Cardiovascular: Normal rate, regular rhythm. Grossly normal heart sounds.  Good peripheral circulation. Respiratory: Normal respiratory effort.  No retractions. Lungs CTAB. Gastrointestinal: Soft and nontender except he does report some mild point tenderness around the right lower quadrant, but there is no distention, rebound or guarding.  Negative psoas.  Negative Rovsing.  Negative Murphy. No distention. Musculoskeletal: No lower extremity tenderness nor edema. Neurologic:  Normal speech and language. No gross focal neurologic deficits are appreciated.  Skin:  Skin is warm, dry and intact. No rash noted. Psychiatric: Mood and affect are normal. Speech and behavior are normal.  ____________________________________________   LABS (all labs ordered are listed, but only abnormal results are displayed)  Labs Reviewed  CBC WITH DIFFERENTIAL/PLATELET  COMPREHENSIVE METABOLIC PANEL  LIPASE, BLOOD  URINALYSIS, COMPLETE (UACMP) WITH MICROSCOPIC   ____________________________________________  EKG   ____________________________________________  RADIOLOGY  CT pending at time of signout ____________________________________________   PROCEDURES  Procedure(s) performed: None  Procedures  Critical Care performed: No  ____________________________________________   INITIAL IMPRESSION / ASSESSMENT AND PLAN / ED COURSE  Pertinent labs & imaging results that were available during my care of the patient were reviewed by me and considered in my medical decision making (see chart for details).   Differential diagnosis includes but is not limited to, abdominal perforation, aortic dissection,  cholecystitis, appendicitis, diverticulitis, colitis, esophagitis/gastritis, kidney stone, pyelonephritis, urinary tract infection, etc. All are considered in decision and treatment plan. Based upon the patient's presentation and risk factors, ongoing symptomatology which she reports worsening over the last couple days repeat labs and obtain a CT scan especially to exclude right lower quadrant pathology such as appendicitis.  Overall he appears well, nontoxic and in no distress.  He does not wish for any pain or antiemetics.  Additionally, he reports and was strongly encouraged as well to keep his appointment Monday with [facility] for HIV management.  ----------------------------------------- 11:12 PM on 06/25/2018 -----------------------------------------  Ongoing care assigned to Dr. Derrill Kay.  Abdominal pain, about 3 days.  Primarily located on the right lower quadrant.  CT pending to evaluate and rule out acute pathology including appendicitis.  Plan to reassess patient, follow-up on all labs which are currently pending at this time.    ____________________________________________   FINAL CLINICAL IMPRESSION(S) / ED DIAGNOSES  Final diagnoses:  Abdominal pain, right lower quadrant        Note:  This document was prepared using Conservation officer, historic buildings and may include unintentional dictation errors       Sharyn Creamer, MD 06/25/18 2313

## 2018-06-25 NOTE — ED Notes (Signed)
Pt went to CT

## 2018-06-26 LAB — URINALYSIS, COMPLETE (UACMP) WITH MICROSCOPIC
Bacteria, UA: NONE SEEN
Bilirubin Urine: NEGATIVE
Glucose, UA: NEGATIVE mg/dL
Hgb urine dipstick: NEGATIVE
Ketones, ur: 5 mg/dL — AB
Leukocytes,Ua: NEGATIVE
Nitrite: NEGATIVE
Protein, ur: NEGATIVE mg/dL
Specific Gravity, Urine: 1 — ABNORMAL LOW (ref 1.005–1.030)
Squamous Epithelial / HPF: NONE SEEN (ref 0–5)
WBC, UA: NONE SEEN WBC/hpf (ref 0–5)
pH: 7 (ref 5.0–8.0)

## 2018-06-26 NOTE — Discharge Instructions (Addendum)
Please seek medical attention for any high fevers, chest pain, shortness of breath, change in behavior, persistent vomiting, bloody stool or any other new or concerning symptoms.  

## 2018-06-26 NOTE — ED Notes (Signed)
Pt provided with blue paper scrubs to wear

## 2018-06-26 NOTE — ED Notes (Signed)
Pt hit the call bell, this RN went into the room; pt stated that "I've just messed myself" and wanted something to clean himself up with. Pt was provided with wipes and unhooked from the IV and the monitoring equipment.

## 2018-06-26 NOTE — ED Notes (Signed)
Pt returned from CT °

## 2018-06-26 NOTE — ED Notes (Signed)

## 2018-06-26 NOTE — ED Provider Notes (Signed)
CT abd.pel No appendicitis. Prominent mesenteric lymph nodes likely reactive.  UA clear, 5 ketones otherwise wnl Lipase 22 CMP wnl except k 3.1, bili 1.4, ast 13, albumin 5.1 CBC wbc 6.6, hgb 15.8, plt 93  Discussed results with patient. At this time no concerning findings for intraabdominal infection. Patient already has prescription for protonix and bentyl which I encouraged him to start taking. Again reiterated importance of following up with HIV doctor to start medication. Answered all of patient's questions.    Phineas Semen, MD 06/26/18 [phone]

## 2018-08-13 DIAGNOSIS — Z8659 Personal history of other mental and behavioral disorders: Secondary | ICD-10-CM | POA: Insufficient documentation

## 2018-08-13 DIAGNOSIS — F418 Other specified anxiety disorders: Secondary | ICD-10-CM | POA: Insufficient documentation

## 2018-09-27 IMAGING — CT CT ABD-PELV W/ CM
2 of 4 series · 14 of 46 positions shown, 16 images · IV contrast (iopamidol)
Comparison: None.

CLINICAL DATA: Initial evaluation for acute right lower quadrant
abdominal pain.

EXAM:
CT ABDOMEN AND PELVIS WITH CONTRAST
TECHNIQUE: Multidetector CT imaging of the abdomen and pelvis was performed
using the standard protocol following bolus administration of
intravenous contrast.
CONTRAST:  75mL YSUUST-2UU IOPAMIDOL (YSUUST-2UU) INJECTION 61%

[Series 2: routine abd/pel with · axial · 0.65mm/px · z∈[-998,-623]mm · 11 of 91 slices shown, 13 images]
[im 8/91  soft-tissue]
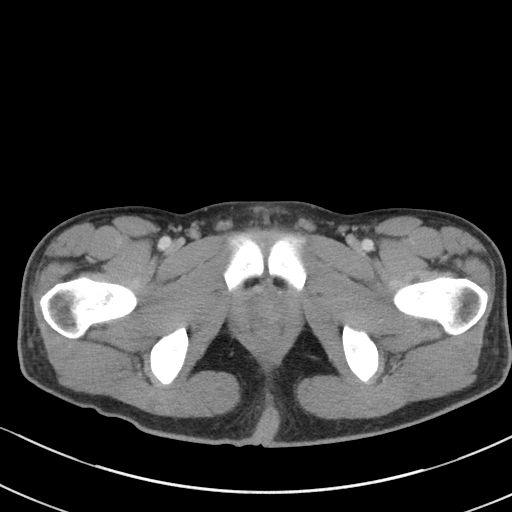
[im 8/91  bone]
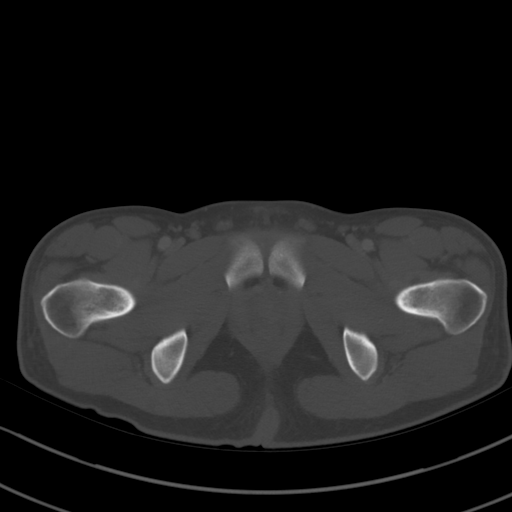
[im 16/91  soft-tissue]
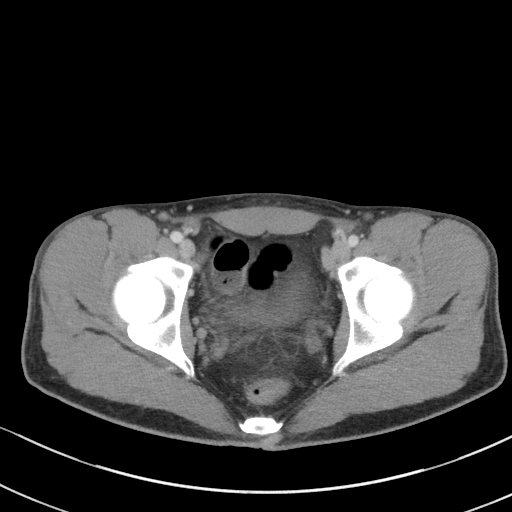
[im 23/91  soft-tissue]
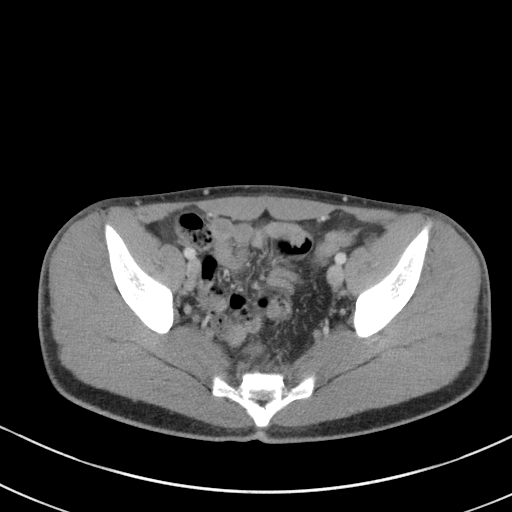
[im 31/91  soft-tissue]
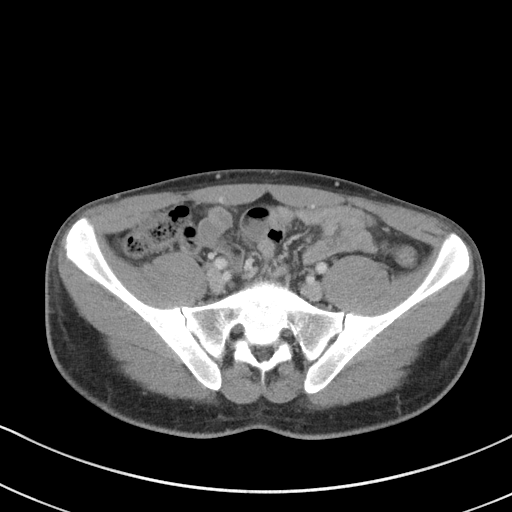
[im 38/91  soft-tissue]
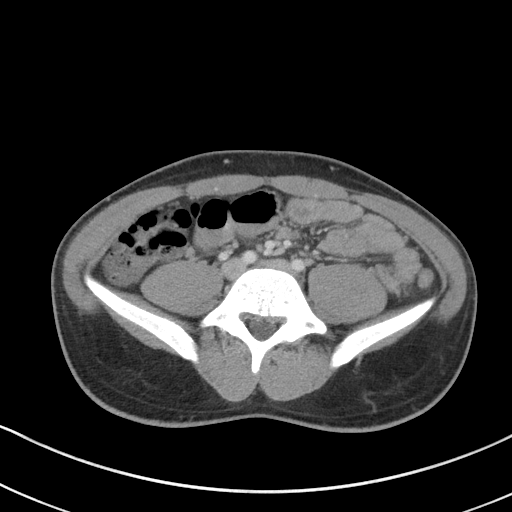
[im 46/91  soft-tissue]
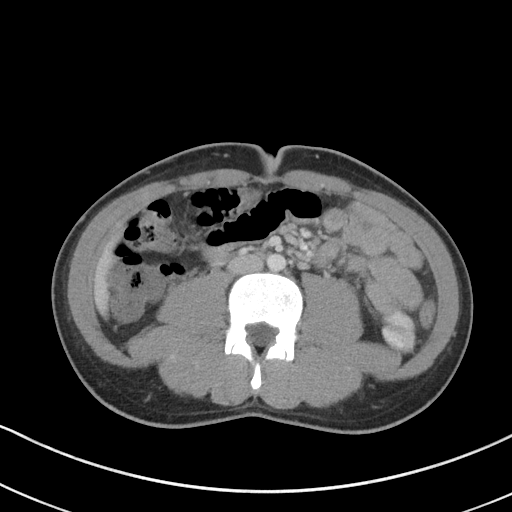
[im 53/91  soft-tissue]
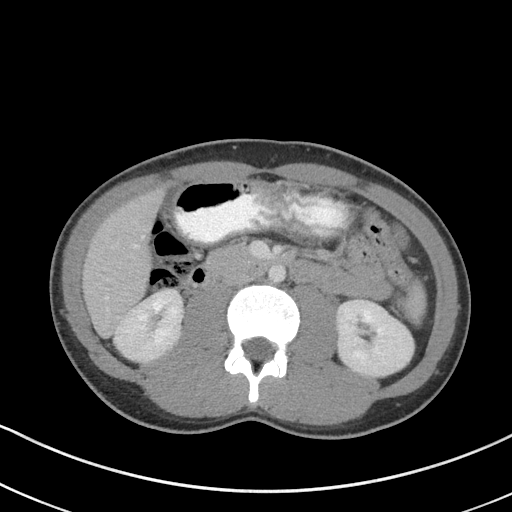
[im 61/91  soft-tissue]
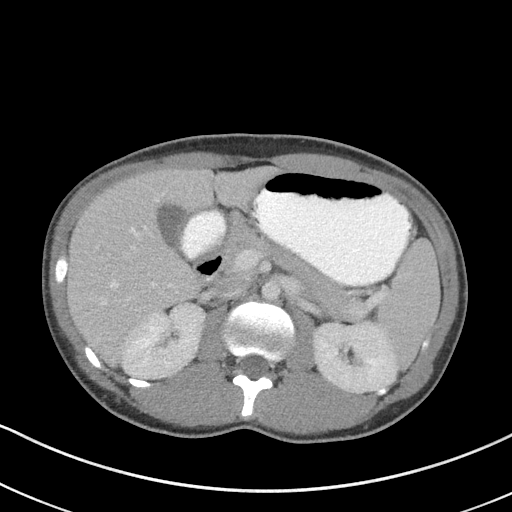
[im 68/91  soft-tissue]
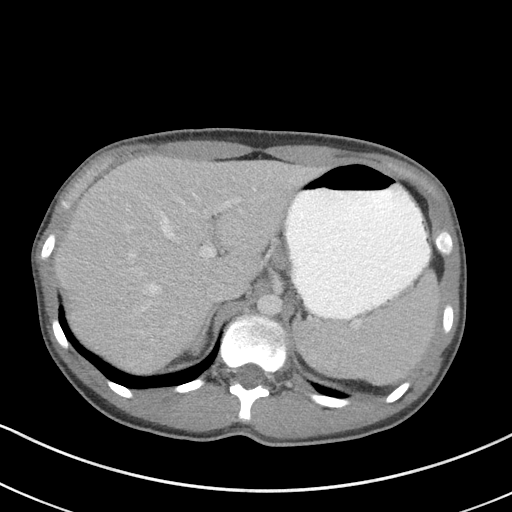
[im 68/91  bone]
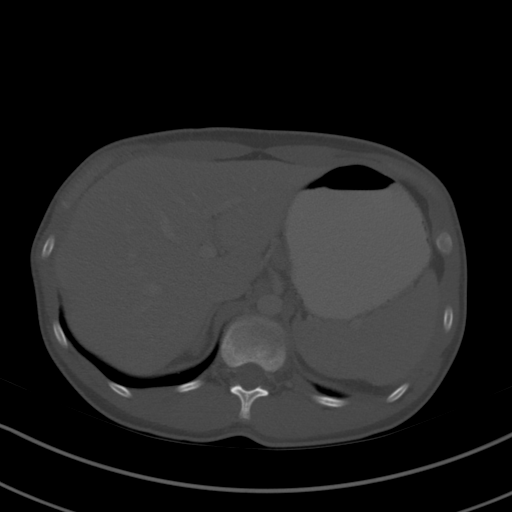
[im 76/91  soft-tissue]
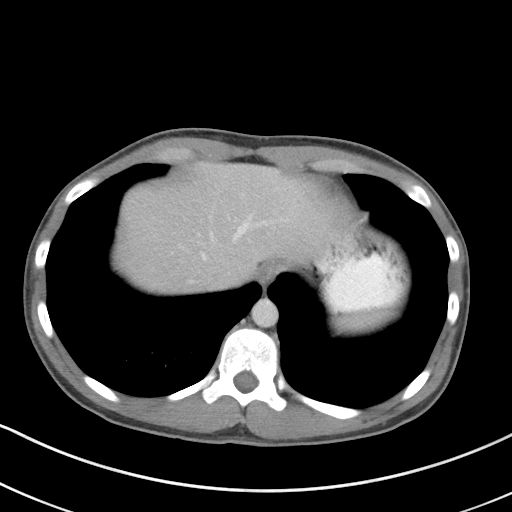
[im 83/91  soft-tissue]
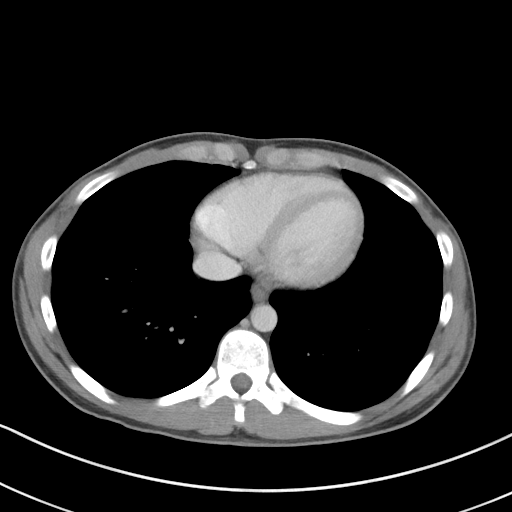

[Series 5: coronal st · coronal · 0.57mm/px · 3 of 68 slices shown]
[im 23/68  soft-tissue]
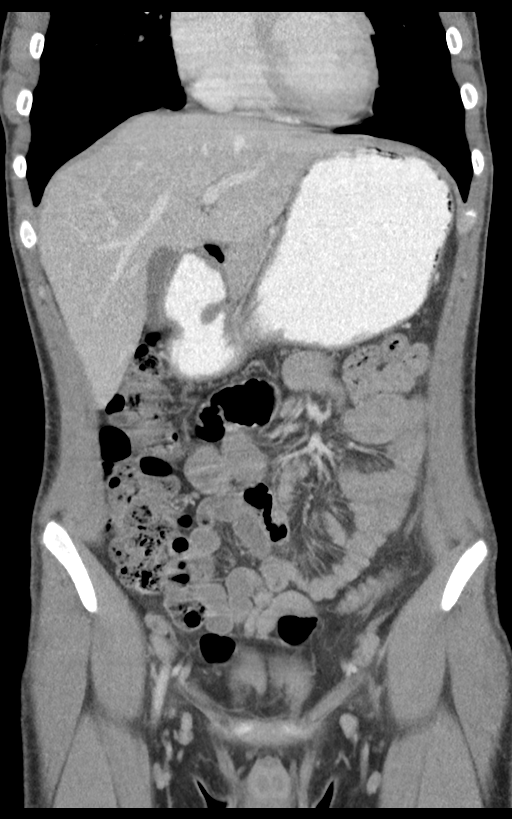
[im 30/68  soft-tissue]
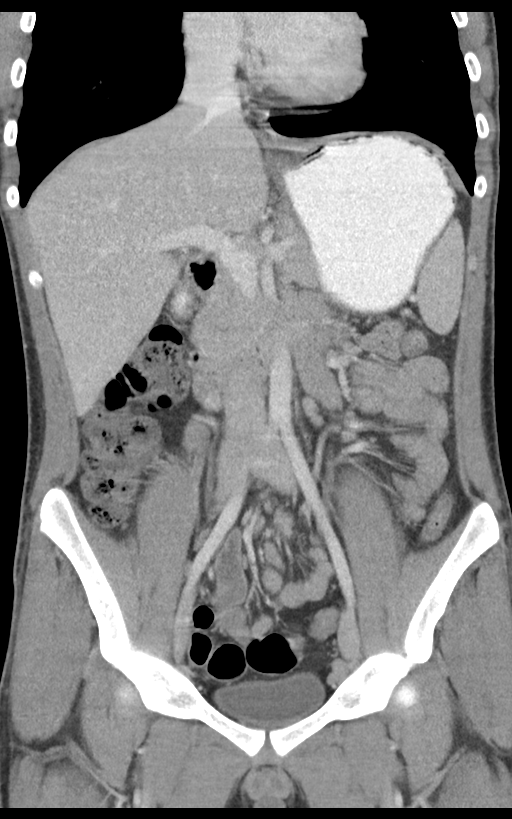
[im 38/68  soft-tissue]
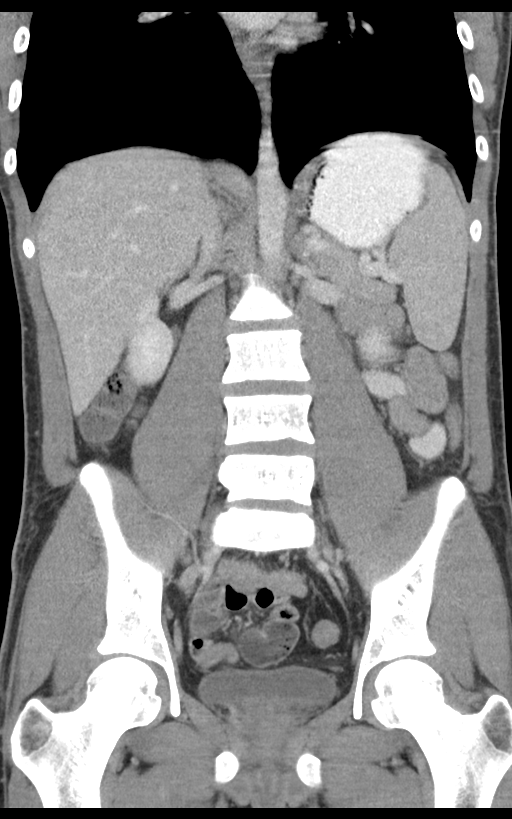

[14 of 46 positions shown; findings below may reference images not displayed]

FINDINGS: Lower chest: Few tiny foci of ground-glass opacity within the
lingula and posterior left lower lobe, favored to reflect
subsegmental atelectasis. Visualized lung bases are otherwise clear.

Hepatobiliary: Liver demonstrates a normal contrast enhanced
appearance. Gallbladder within normal limits. No biliary dilatation.

Pancreas: Pancreas within normal limits.

Spleen: Spleen within normal limits.

Adrenals/Urinary Tract: Adrenal glands are normal. Kidneys equal in
size with symmetric enhancement. No nephrolithiasis, hydronephrosis,
or focal enhancing renal mass. No hydroureter. Bladder within normal
limits.

Stomach/Bowel: Stomach mildly distended with oral contrast material
within the gastric lumen. Stomach otherwise unremarkable. No
evidence for bowel obstruction. Appendix visualized within the right
lower quadrant and is of normal caliber and appearance associated
inflammatory changes to suggest acute appendicitis. No acute
inflammatory changes about the adjacent terminal ileum. Colon
diffusely decompressed with associated mild circumferential wall
thickening. While this is favored to be related incomplete
distension, possible mild acute colitis not entirely excluded. No
other acute abnormality identified about the bowels.

Vascular/Lymphatic: Normal intravascular enhancement seen throughout
the intra-abdominal aorta and its branch vessels. No pathologically
enlarged intra-abdominal or pelvic lymph nodes identified.

Reproductive: Prostate normal.

Other: No free air or fluid.

Musculoskeletal: No acute osseous abnormality. No worrisome lytic or
blastic osseous lesions.
IMPRESSION: 1. Diffusely the compression of the descending and sigmoid colon
within the left abdomen with associated circumferential wall
thickening. While this wall thickening is favored to be related to
incomplete distension, possible mild acute colitis is not entirely
excluded, and could be considered in the correct clinical setting.
2. No other acute intra-abdominal or pelvic process identified.
3. Normal appendix.

## 2019-09-03 ENCOUNTER — Emergency Department
Admission: EM | Admit: 2019-09-03 | Discharge: 2019-09-03 | Disposition: A | Discharge status: Home / Self Care | Payer: Self-pay | Attending: Emergency Medicine | Admitting: Emergency Medicine

## 2019-09-03 ENCOUNTER — Other Ambulatory Visit: Payer: Self-pay

## 2019-09-03 DIAGNOSIS — Z5321 Procedure and treatment not carried out due to patient leaving prior to being seen by health care provider: Secondary | ICD-10-CM | POA: Insufficient documentation

## 2019-09-03 DIAGNOSIS — M545 Low back pain: Secondary | ICD-10-CM | POA: Insufficient documentation

## 2019-09-03 NOTE — ED Triage Notes (Signed)
Pt states injured lower back last night by helping co-worker lift car of of co-worker. States didn't lift with correct techniques. Pain increases with movement. States had clocked out prior to lifting car so no workers comp. A&O, ambulatory.

## 2019-12-05 ENCOUNTER — Other Ambulatory Visit: Payer: Self-pay

## 2019-12-05 ENCOUNTER — Emergency Department
Admission: EM | Admit: 2019-12-05 | Discharge: 2019-12-05 | Disposition: A | Discharge status: Home / Self Care | Payer: Self-pay | Attending: Emergency Medicine | Admitting: Emergency Medicine

## 2019-12-05 ENCOUNTER — Encounter: Payer: Self-pay | Admitting: Emergency Medicine

## 2019-12-05 DIAGNOSIS — K625 Hemorrhage of anus and rectum: Secondary | ICD-10-CM | POA: Insufficient documentation

## 2019-12-05 DIAGNOSIS — F17210 Nicotine dependence, cigarettes, uncomplicated: Secondary | ICD-10-CM | POA: Insufficient documentation

## 2019-12-05 DIAGNOSIS — J45909 Unspecified asthma, uncomplicated: Secondary | ICD-10-CM | POA: Insufficient documentation

## 2019-12-05 LAB — COMPREHENSIVE METABOLIC PANEL
ALT: 15 U/L (ref 0–44)
AST: 14 U/L — ABNORMAL LOW (ref 15–41)
Albumin: 4.5 g/dL (ref 3.5–5.0)
Alkaline Phosphatase: 62 U/L (ref 38–126)
Anion gap: 10 (ref 5–15)
BUN: 11 mg/dL (ref 6–20)
CO2: 24 mmol/L (ref 22–32)
Calcium: 9.5 mg/dL (ref 8.9–10.3)
Chloride: 105 mmol/L (ref 98–111)
Creatinine, Ser: 0.99 mg/dL (ref 0.61–1.24)
GFR calc Af Amer: >60 mL/min (ref >60)
GFR calc non Af Amer: >60 mL/min (ref >60)
Glucose, Bld: 112 mg/dL — ABNORMAL HIGH (ref 70–99)
Potassium: 3.6 mmol/L (ref 3.5–5.1)
Sodium: 139 mmol/L (ref 135–145)
Total Bilirubin: 0.8 mg/dL (ref 0.3–1.2)
Total Protein: 7.6 g/dL (ref 6.5–8.1)

## 2019-12-05 LAB — CBC WITH DIFFERENTIAL/PLATELET
Abs Immature Granulocytes: 0.02 10*3/uL (ref 0.00–0.07)
Basophils Absolute: 0.1 10*3/uL (ref 0.0–0.1)
Basophils Relative: 1 %
Eosinophils Absolute: 0.7 10*3/uL — ABNORMAL HIGH (ref 0.0–0.5)
Eosinophils Relative: 10 %
HCT: 42.9 % (ref 39.0–52.0)
Hemoglobin: 15.4 g/dL (ref 13.0–17.0)
Immature Granulocytes: 0 %
Lymphocytes Relative: 32 %
Lymphs Abs: 2.4 10*3/uL (ref 0.7–4.0)
MCH: 32 pg (ref 26.0–34.0)
MCHC: 35.9 g/dL (ref 30.0–36.0)
MCV: 89 fL (ref 80.0–100.0)
Monocytes Absolute: 0.7 10*3/uL (ref 0.1–1.0)
Monocytes Relative: 9 %
Neutro Abs: 3.6 10*3/uL (ref 1.7–7.7)
Neutrophils Relative %: 48 %
Platelets: 172 10*3/uL (ref 150–400)
RBC: 4.82 MIL/uL (ref 4.22–5.81)
RDW: 12.4 % (ref 11.5–15.5)
WBC: 7.5 10*3/uL (ref 4.0–10.5)
nRBC: 0 % (ref 0.0–0.2)

## 2019-12-05 LAB — URINALYSIS, COMPLETE (UACMP) WITH MICROSCOPIC
Bacteria, UA: NONE SEEN
Bilirubin Urine: NEGATIVE
Glucose, UA: NEGATIVE mg/dL
Hgb urine dipstick: NEGATIVE
Ketones, ur: NEGATIVE mg/dL
Leukocytes,Ua: NEGATIVE
Nitrite: NEGATIVE
Protein, ur: NEGATIVE mg/dL
Specific Gravity, Urine: 1.004 — ABNORMAL LOW (ref 1.005–1.030)
Squamous Epithelial / HPF: NONE SEEN (ref 0–5)
WBC, UA: NONE SEEN WBC/hpf (ref 0–5)
pH: 8 (ref 5.0–8.0)

## 2019-12-05 MED ORDER — DOCUSATE SODIUM 100 MG PO CAPS: 100.0000 mg | ORAL_CAPSULE | Freq: Two times a day (BID) | ORAL | 0 refills | Status: AC

## 2019-12-05 NOTE — ED Provider Notes (Signed)
[facility] Emergency Department Provider Note   ____________________________________________   First MD Initiated Contact with Patient 12/05/19 1106     (approximate)  I have reviewed the triage vital signs and the nursing notes.   HISTORY  Chief Complaint Rectal Bleeding    HPI Collin Jones is a 24 y.o. male patient complaint of rectal bleeding with a.m. defecation.  Patient all week and has been constipated.  Patient stated first bowel movement in 3 days.  Noticed blood on tissue paper.  Denies pain at this time.  States feeling better after bowel movement.  Patient has history of IBS and is HIV positive.         Past Medical History:  Diagnosis Date  . Asthma   . HIV (human immunodeficiency virus infection) (HCC)   . IBS (irritable bowel syndrome)     There are no problems to display for this patient.   History reviewed. No pertinent surgical history.  Prior to Admission medications   Medication Sig Start Date End Date Taking? Authorizing Provider  albuterol (PROVENTIL HFA;VENTOLIN HFA) 108 (90 BASE) MCG/ACT inhaler Inhale 2 puffs into the lungs every 4 (four) hours as needed for wheezing or shortness of breath. 12/24/14   Irean Hong, MD  chlorpheniramine-HYDROcodone (TUSSIONEX PENNKINETIC ER) 10-8 MG/5ML SUER Take 5 mLs by mouth 2 (two) times daily. 12/24/14   Irean Hong, MD  dicyclomine (BENTYL) 20 MG tablet Take 1 tablet (20 mg total) by mouth 3 (three) times daily as needed for spasms. 05/19/18 05/19/19  Minna Antis, MD  dicyclomine (BENTYL) 20 MG tablet Take 1 tablet (20 mg total) by mouth 3 (three) times daily as needed for spasms. 06/23/18 06/23/19  Darci Current, MD  docusate sodium (COLACE) 100 MG capsule Take 1 capsule (100 mg total) by mouth 2 (two) times daily. 12/05/19 12/04/20  Joni Reining, PA-C  HYDROcodone-acetaminophen (NORCO/VICODIN) 5-325 MG tablet Take 1-2 tablets by mouth every 4 (four) hours as needed for  moderate pain. 04/16/16   Loleta Rose, MD  ondansetron (ZOFRAN ODT) 4 MG disintegrating tablet Allow 1-2 tablets to dissolve in your mouth every 8 hours as needed for nausea/vomiting 04/16/16   Loleta Rose, MD  pantoprazole (PROTONIX) 20 MG tablet Take 1 tablet (20 mg total) by mouth daily. 06/23/18   Darci Current, MD  predniSONE (DELTASONE) 20 MG tablet 3 tablets PO qd x 4 days 04/12/18   Irean Hong, MD    Allergies Patient has no known allergies.  No family history on file.  Social History Social History   Tobacco Use  . Smoking status: Current Some Day Smoker    Types: Cigarettes  . Smokeless tobacco: Never Used  Substance Use Topics  . Alcohol use: No    Comment: rarely  . Drug use: Yes    Types: Marijuana    Comment: monthly use    Review of Systems Constitutional: No fever/chills Eyes: No visual changes. ENT: No sore throat. Cardiovascular: Denies chest pain. Respiratory: Denies shortness of breath. Gastrointestinal: No abdominal pain.  No nausea, no vomiting.  No diarrhea.  Constipation with rectal bleeding.  Genitourinary: Negative for dysuria. Musculoskeletal: Negative for back pain. Skin: Negative for rash. Neurological: Negative for headaches, focal weakness or numbness. Allergic/Immunilogical: HIV.  ____________________________________________   PHYSICAL EXAM:  VITAL SIGNS: ED Triage Vitals  Enc Vitals Group     BP 12/05/19 0925 (!) 142/89     Pulse Rate 12/05/19 0925 90  Resp 12/05/19 0925 20     Temp 12/05/19 0925 98.5 F (36.9 C)     Temp Source 12/05/19 0925 Oral     SpO2 12/05/19 0925 100 %     Weight 12/05/19 0924 141 lb 1.5 oz (64 kg)     Height 12/05/19 0924 5\' 2"  (1.575 m)     Head Circumference --      Peak Flow --      Pain Score 12/05/19 0925 0     Pain Loc --      Pain Edu? --      Excl. in GC? --    Constitutional: Alert and oriented. Well appearing and in no acute distress. Hematological/Lymphatic/Immunilogical: No  cervical lymphadenopathy. Cardiovascular: Normal rate, regular rhythm. Grossly normal heart sounds.  Good peripheral circulation. Respiratory: Normal respiratory effort.  No retractions. Lungs CTAB. Gastrointestinal: Soft and nontender. No distention. No abdominal bruits. No CVA tenderness.  No external rectal lesions.  Guaiac positive. Musculoskeletal: No lower extremity tenderness nor edema.  No joint effusions. Neurologic:  Normal speech and language. No gross focal neurologic deficits are appreciated. No gait instability. Skin:  Skin is warm, dry and intact. No rash noted. Psychiatric: Mood and affect are normal. Speech and behavior are normal.  ____________________________________________   LABS (all labs ordered are listed, but only abnormal results are displayed)  Labs Reviewed  CBC WITH DIFFERENTIAL/PLATELET - Abnormal; Notable for the following components:      Result Value   Eosinophils Absolute 0.7 (*)    All other components within normal limits  COMPREHENSIVE METABOLIC PANEL - Abnormal; Notable for the following components:   Glucose, Bld 112 (*)    AST 14 (*)    All other components within normal limits  URINALYSIS, COMPLETE (UACMP) WITH MICROSCOPIC - Abnormal; Notable for the following components:   Color, Urine STRAW (*)    APPearance CLEAR (*)    Specific Gravity, Urine 1.004 (*)    All other components within normal limits   ____________________________________________  EKG   ____________________________________________  RADIOLOGY  ED MD interpretation:    Official radiology report(s): No results found.  ____________________________________________   PROCEDURES  Procedure(s) performed (including Critical Care):  Procedures   ____________________________________________   INITIAL IMPRESSION / ASSESSMENT AND PLAN / ED COURSE  As part of my medical decision making, I reviewed the following data within the electronic MEDICAL RECORD NUMBER      Patient presents with a" rectal bleeding status post bowel movement.  Patient was constipated over the weekend.  Patient today felt better after bowel movement.  Physical exam showed patient had no external lesion but was quite positive digital exam.  Patient given discharge care instruction advised to follow-up with gastroenterologist.  Patient request was given a prescription for Colace.          ____________________________________________   FINAL CLINICAL IMPRESSION(S) / ED DIAGNOSES  Final diagnoses:  Rectal bleeding     ED Discharge Orders         Ordered    docusate sodium (COLACE) 100 MG capsule  2 times daily        12/05/19 1148          *Please note:  Collin Jones was evaluated in Emergency Department on 12/05/2019 for the symptoms described in the history of present illness. He was evaluated in the context of the global COVID-19 pandemic, which necessitated consideration that the patient might be at risk for infection with the SARS-CoV-2 virus that causes COVID-19. Institutional  protocols and algorithms that pertain to the evaluation of patients at risk for COVID-19 are in a state of rapid change based on information released by regulatory bodies including the CDC and federal and state organizations. These policies and algorithms were followed during the patient's care in the ED.  Some ED evaluations and interventions may be delayed as a result of limited staffing during and the pandemic.*   Note:  This document was prepared using Dragon voice recognition software and may include unintentional dictation errors.    Joni Reining, PA-C 12/05/19 1155    Jene Every, MD 12/05/19 [phone]

## 2019-12-05 NOTE — Discharge Instructions (Signed)
Patient given discharge care instruction advised follow-up with gastroenterologist.  Patient also given prescription for stool softeners.

## 2019-12-05 NOTE — ED Triage Notes (Signed)
Presents with poss rectal bleeding  States he noticed some bright red blood this am   Denies any pain  Pt is very anxious

## 2020-12-05 IMAGING — CT CT ABDOMEN AND PELVIS WITH CONTRAST
2 of 4 series · 16 of 46 positions shown, 18 images · IV contrast (APPLIED)
Comparison: CT 04/16/2016

CLINICAL DATA: Right lower quadrant pain for 2 days.

EXAM:
CT ABDOMEN AND PELVIS WITH CONTRAST
TECHNIQUE: Multidetector CT imaging of the abdomen and pelvis was performed
using the standard protocol following bolus administration of
intravenous contrast.
CONTRAST:  100mL OMNIPAQUE IOHEXOL 300 MG/ML  SOLN

[Series 2: routine abd/pel with · axial · 0.70mm/px · z∈[-1009,-589]mm · 13 of 92 slices shown, 15 images]
[im 4/92  soft-tissue]
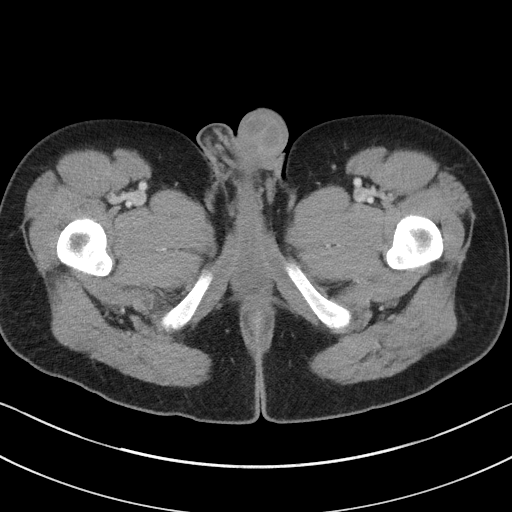
[im 4/92  bone]
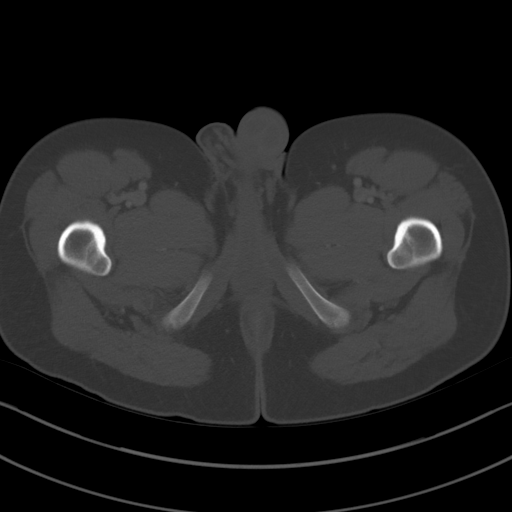
[im 12/92  soft-tissue]
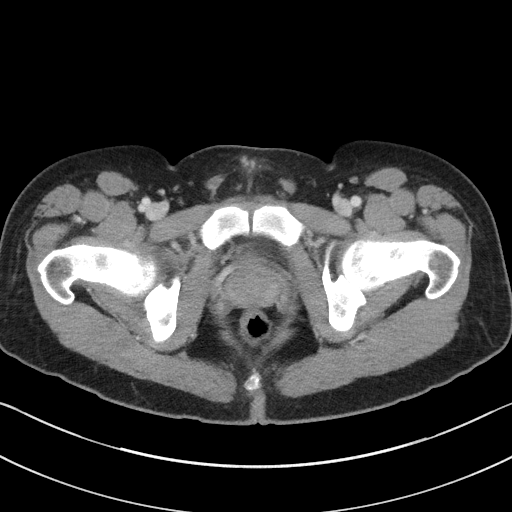
[im 20/92  soft-tissue]
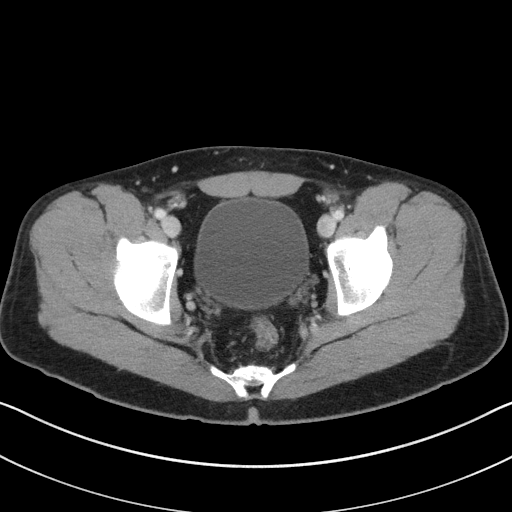
[im 24/92  soft-tissue]
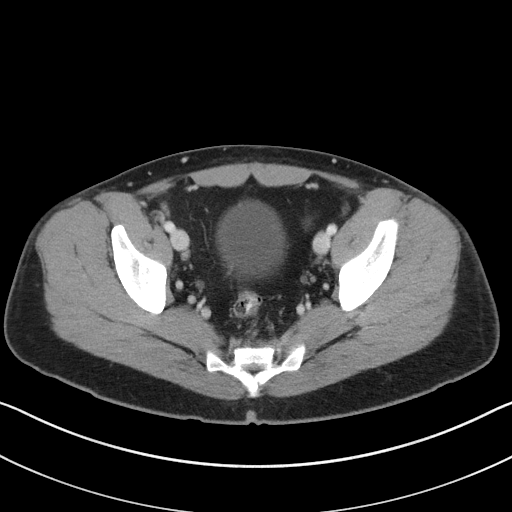
[im 32/92  soft-tissue]
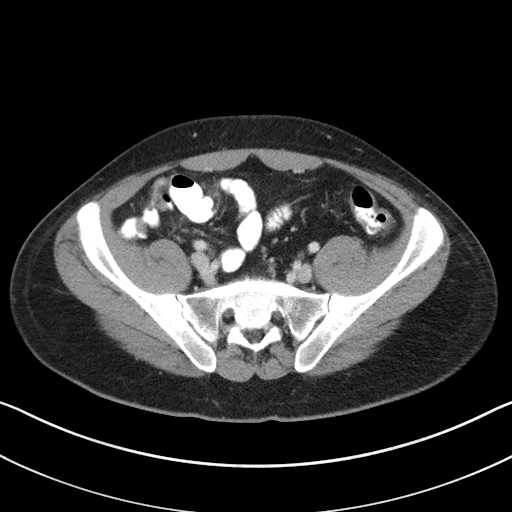
[im 40/92  soft-tissue]
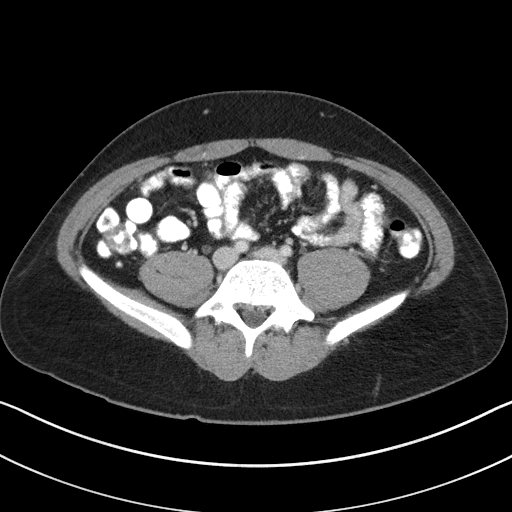
[im 48/92  soft-tissue]
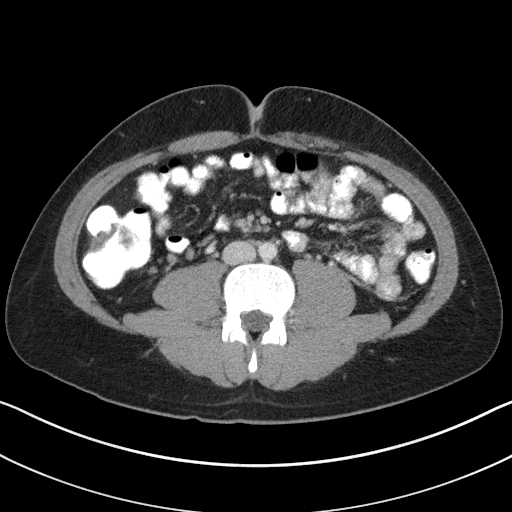
[im 52/92  soft-tissue]
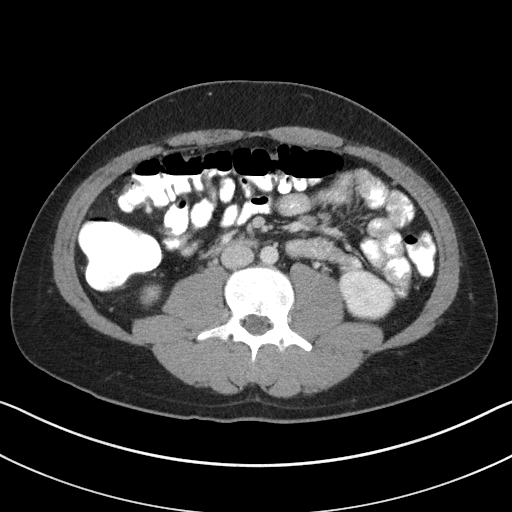
[im 60/92  soft-tissue]
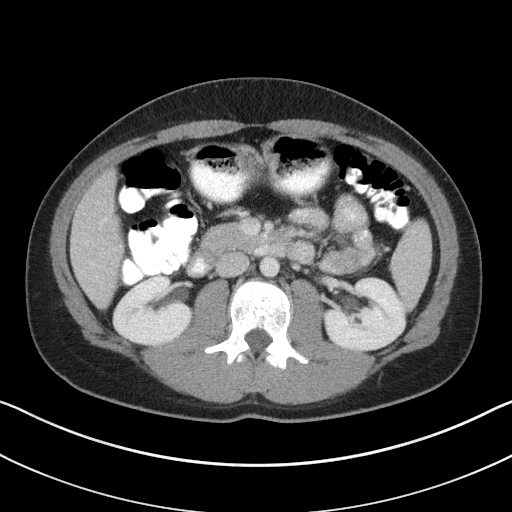
[im 60/92  bone]
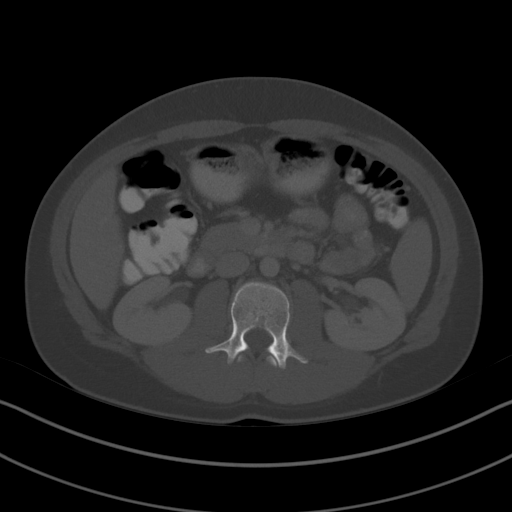
[im 68/92  soft-tissue]
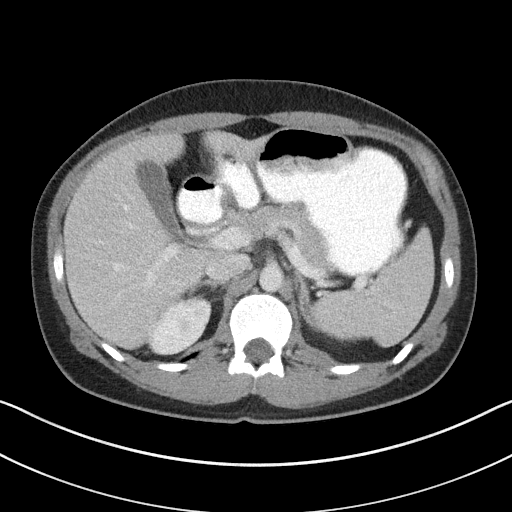
[im 72/92  soft-tissue]
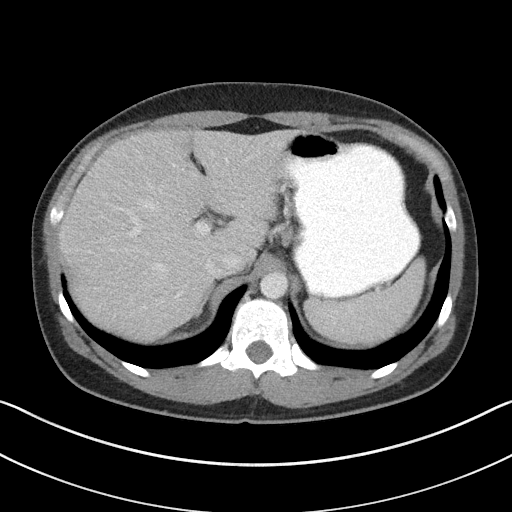
[im 80/92  soft-tissue]
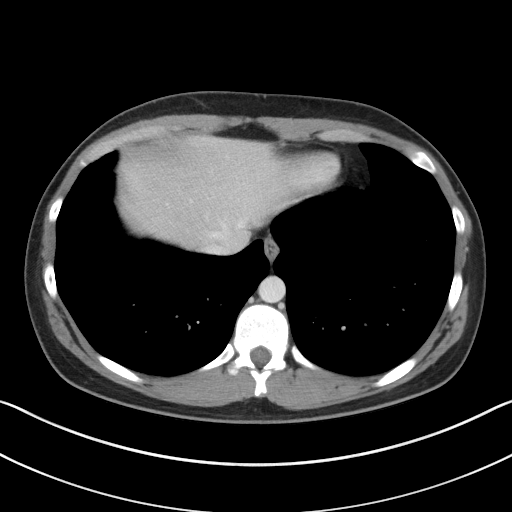
[im 88/92  soft-tissue]
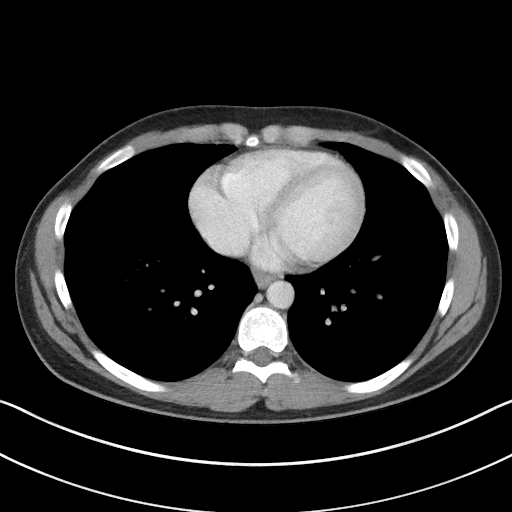

[Series 5: coronal st · coronal · 0.73mm/px · 3 of 76 slices shown]
[im 26/76  soft-tissue]
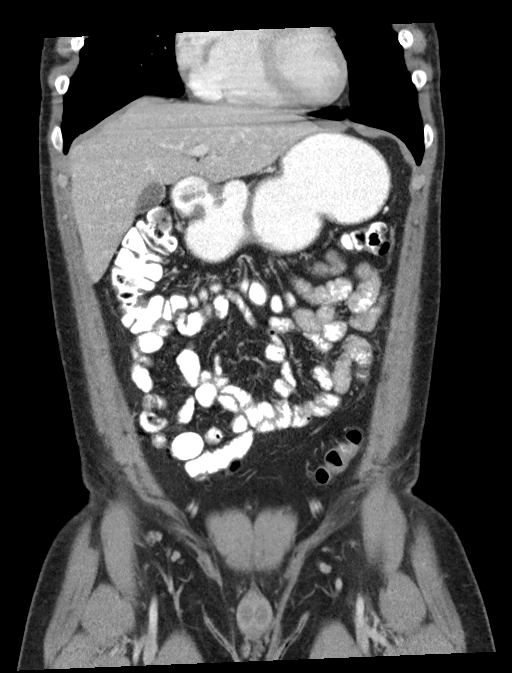
[im 34/76  soft-tissue]
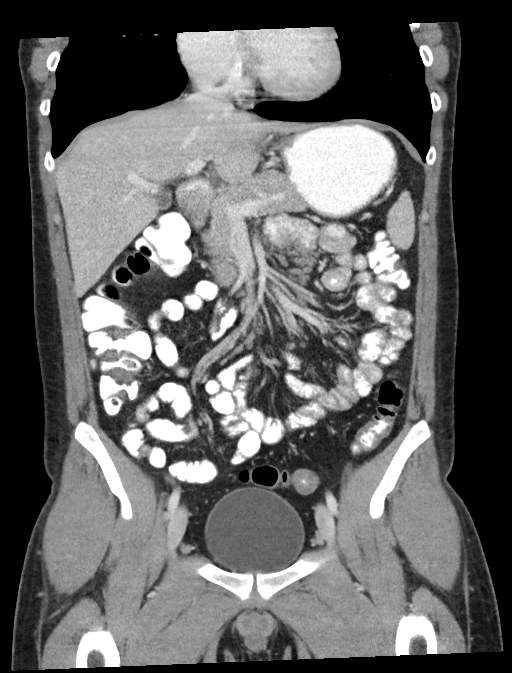
[im 42/76  soft-tissue]
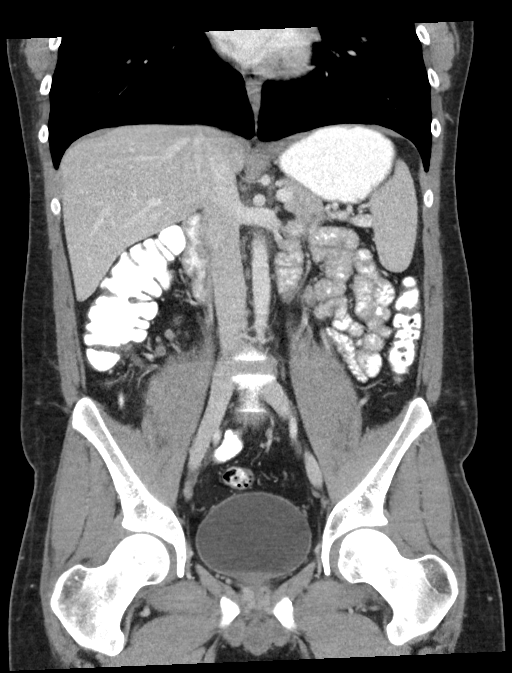

[16 of 46 positions shown; findings below may reference images not displayed]

FINDINGS: Lower chest: Lung bases are clear.

Hepatobiliary: No focal liver abnormality is seen. No gallstones,
gallbladder wall thickening, or biliary dilatation.

Pancreas: No ductal dilatation or inflammation.

Spleen: Normal in size without focal abnormality.

Adrenals/Urinary Tract: Normal adrenal glands. No hydronephrosis or
perinephric edema. Homogeneous renal enhancement. Urinary bladder is
physiologically distended without wall thickening.

Stomach/Bowel: Stomach is within normal limits. Appendix is normal,
contrast filled, for example image 53 series 2. No evidence of bowel
wall thickening, distention, or inflammatory changes.

Vascular/Lymphatic: Normal caliber abdominal aorta. Portal vein and
mesenteric vessels are patent. Prominent central mesenteric nodes
are likely reactive.

Reproductive: Prostate is unremarkable.

Other: No free air, free fluid, or intra-abdominal fluid collection.

Musculoskeletal: There are no acute or suspicious osseous
abnormalities.
IMPRESSION: 1. Normal appendix.
2. Prominent central mesenteric nodes are likely reactive. No other
acute findings.

## 2021-06-07 ENCOUNTER — Other Ambulatory Visit: Payer: Self-pay

## 2021-06-07 ENCOUNTER — Ambulatory Visit
Admission: RE | Admit: 2021-06-07 | Discharge: 2021-06-07 | Disposition: A | Discharge status: Home / Self Care | Payer: Self-pay | Source: Ambulatory Visit

## 2021-06-07 VITALS — BP 112/69 | HR 109 | Temp 101.8°F | Resp 18

## 2021-06-07 DIAGNOSIS — R509 Fever, unspecified: Secondary | ICD-10-CM

## 2021-06-07 DIAGNOSIS — J020 Streptococcal pharyngitis: Secondary | ICD-10-CM

## 2021-06-07 LAB — POCT RAPID STREP A (OFFICE): Rapid Strep A Screen: POSITIVE — AB

## 2021-06-07 MED ORDER — AMOXICILLIN 400 MG/5ML PO SUSR: 500.0000 mg | Freq: Two times a day (BID) | ORAL | 0 refills | Status: AC

## 2021-06-07 NOTE — Discharge Instructions (Addendum)
You have strep throat.  Take the amoxicillin as directed.  Take Tylenol as needed for fever or discomfort.  Follow up with your primary care provider if your symptoms are not improving.   ? ?

## 2021-06-07 NOTE — ED Triage Notes (Signed)
Pt c/o ST x 3-4 days. ?

## 2021-06-07 NOTE — ED Provider Notes (Signed)
?[facility] ? ? ? ?CSN: [id number] ?Arrival date & time: 06/07/21  1443 ? ? ?  ? ?History   ?Chief Complaint ?Chief Complaint  ?Patient presents with  ? Sore Throat  ?  85% of friends have strep throat - Entered by patient  ? ? ?HPI ?Collin Jones is a 26 y.o. male.  Patient presents with 3 to 4 day history of fever, chills, fatigue, sore throat.  1 episode of emesis today.  He denies rash, cough, shortness of breath, diarrhea, or other symptoms.  No OTC medications taken today.  His medical history includes asthma, IBS, HIV. ? ?The history is provided by the patient and medical records.  ? ?Past Medical History:  ?Diagnosis Date  ? Asthma   ? HIV (human immunodeficiency virus infection) (HCC)   ? IBS (irritable bowel syndrome)   ? ? ?There are no problems to display for this patient. ? ? ?History reviewed. No pertinent surgical history. ? ? ? ? ?Home Medications   ? ?Prior to Admission medications   ?Medication Sig Start Date End Date Taking? Authorizing Provider  ?amoxicillin (AMOXIL) 400 MG/5ML suspension Take 6.3 mLs (500 mg total) by mouth 2 (two) times daily for 10 days. 06/07/21 06/17/21 Yes Mickie Bail, NP  ?BIKTARVY 50-200-25 MG TABS tablet Take 1 tablet by mouth daily. 05/28/21  Yes [provider]  ?albuterol (PROVENTIL HFA;VENTOLIN HFA) 108 (90 BASE) MCG/ACT inhaler Inhale 2 puffs into the lungs every 4 (four) hours as needed for wheezing or shortness of breath. 12/24/14   Irean Hong, MD  ?chlorpheniramine-HYDROcodone [facility]) 10-8 MG/5ML SUER Take 5 mLs by mouth 2 (two) times daily. 12/24/14   Irean Hong, MD  ?dicyclomine (BENTYL) 20 MG tablet Take 1 tablet (20 mg total) by mouth 3 (three) times daily as needed for spasms. 05/19/18 05/19/19  Minna Antis, MD  ?dicyclomine (BENTYL) 20 MG tablet Take 1 tablet (20 mg total) by mouth 3 (three) times daily as needed for spasms. 06/23/18 06/23/19  Darci Current, MD  ?HYDROcodone-acetaminophen (NORCO/VICODIN) 5-325 MG  tablet Take 1-2 tablets by mouth every 4 (four) hours as needed for moderate pain. 04/16/16   Loleta Rose, MD  ?ondansetron (ZOFRAN ODT) 4 MG disintegrating tablet Allow 1-2 tablets to dissolve in your mouth every 8 hours as needed for nausea/vomiting 04/16/16   Loleta Rose, MD  ?pantoprazole (PROTONIX) 20 MG tablet Take 1 tablet (20 mg total) by mouth daily. 06/23/18   Darci Current, MD  ?predniSONE (DELTASONE) 20 MG tablet 3 tablets PO qd x 4 days 04/12/18   Irean Hong, MD  ? ? ?Family History ?History reviewed. No pertinent family history. ? ?Social History ?Social History  ? ?Tobacco Use  ? Smoking status: Some Days  ?  Types: Cigarettes  ? Smokeless tobacco: Never  ?Vaping Use  ? Vaping Use: Every day  ?Substance Use Topics  ? Alcohol use: No  ?  Comment: rarely  ? Drug use: Yes  ?  Types: Marijuana  ?  Comment: monthly use  ? ? ? ?Allergies   ?Patient has no known allergies. ? ? ?Review of Systems ?Review of Systems  ?Constitutional:  Positive for chills, fatigue and fever.  ?HENT:  Positive for sore throat. Negative for ear pain.   ?Respiratory:  Negative for cough and shortness of breath.   ?Cardiovascular:  Negative for chest pain and palpitations.  ?Gastrointestinal:  Positive for vomiting. Negative for abdominal pain and diarrhea.  ?Skin:  Negative for color change and rash.  ?All other systems reviewed and are negative. ? ? ?Physical Exam ?Triage Vital Signs ?ED Triage Vitals  ?Enc Vitals Group  ?   BP   ?   Pulse   ?   Resp   ?   Temp   ?   Temp src   ?   SpO2   ?   Weight   ?   Height   ?   Head Circumference   ?   Peak Flow   ?   Pain Score   ?   Pain Loc   ?   Pain Edu?   ?   Excl. in GC?   ? ?No data found. ? ?Updated Vital Signs ?BP 112/69   Pulse (!) 109   Temp (!) 101.8 ?F (38.8 ?C)   Resp 18   SpO2 98%  ? ?Visual Acuity ?Right Eye Distance:   ?Left Eye Distance:   ?Bilateral Distance:   ? ?Right Eye Near:   ?Left Eye Near:    ?Bilateral Near:    ? ?Physical Exam ?Vitals and nursing note  reviewed.  ?Constitutional:   ?   General: He is not in acute distress. ?   Appearance: Normal appearance. He is well-developed. He is not ill-appearing.  ?HENT:  ?   Right Ear: Tympanic membrane normal.  ?   Left Ear: Tympanic membrane normal.  ?   Nose: Nose normal.  ?   Mouth/Throat:  ?   Mouth: Mucous membranes are moist.  ?   Pharynx: Posterior oropharyngeal erythema present.  ?Cardiovascular:  ?   Rate and Rhythm: Normal rate and regular rhythm.  ?   Heart sounds: Normal heart sounds.  ?Pulmonary:  ?   Effort: Pulmonary effort is normal. No respiratory distress.  ?   Breath sounds: Normal breath sounds.  ?Abdominal:  ?   Palpations: Abdomen is soft.  ?   Tenderness: There is no abdominal tenderness.  ?Musculoskeletal:  ?   Cervical back: Neck supple.  ?Skin: ?   General: Skin is warm and dry.  ?Neurological:  ?   Mental Status: He is alert.  ?Psychiatric:     ?   Mood and Affect: Mood normal.     ?   Behavior: Behavior normal.  ? ? ? ?UC Treatments / Results  ?Labs ?(all labs ordered are listed, but only abnormal results are displayed) ?Labs Reviewed  ?POCT RAPID STREP A (OFFICE) - Abnormal; Notable for the following components:  ?    Result Value  ? Rapid Strep A Screen Positive (*)   ? All other components within normal limits  ? ? ?EKG ? ? ?Radiology ?No results found. ? ?Procedures ?Procedures (including critical care time) ? ?Medications Ordered in UC ?Medications - No data to display ? ?Initial Impression / Assessment and Plan / UC Course  ?I have reviewed the triage vital signs and the nursing notes. ? ?Pertinent labs & imaging results that were available during my care of the patient were reviewed by me and considered in my medical decision making (see chart for details). ? ?  ?Strep pharyngitis, fever.  Rapid strep positive.  Treating with amoxicillin.  Discussed Tylenol as needed for fever or discomfort.  Instructed patient to follow-up with his PCP if his symptoms are not improving.  He agrees to  plan of care. ? ?Final Clinical Impressions(s) / UC Diagnoses  ? ?Final diagnoses:  ?Strep pharyngitis  ?Fever, unspecified  ? ? ? ?  Discharge Instructions   ? ?  ?You have strep throat.  Take the amoxicillin as directed.  Take Tylenol as needed for fever or discomfort.  Follow up with your primary care provider if your symptoms are not improving.   ? ? ? ? ? ?ED Prescriptions   ? ? Medication Sig Dispense Auth. Provider  ? amoxicillin (AMOXIL) 400 MG/5ML suspension Take 6.3 mLs (500 mg total) by mouth 2 (two) times daily for 10 days. 126 mL Mickie Bail, NP  ? ?  ? ?PDMP not reviewed this encounter. ?  ?Mickie Bail, NP ?06/07/21 1529 ? ?

## 2021-06-18 ENCOUNTER — Other Ambulatory Visit: Payer: Self-pay

## 2021-06-18 ENCOUNTER — Emergency Department
Admission: EM | Admit: 2021-06-18 | Discharge: 2021-06-18 | Disposition: A | Discharge status: Home / Self Care | Payer: Self-pay | Attending: Emergency Medicine | Admitting: Emergency Medicine

## 2021-06-18 DIAGNOSIS — F410 Panic disorder [episodic paroxysmal anxiety] without agoraphobia: Secondary | ICD-10-CM | POA: Insufficient documentation

## 2021-06-18 MED ORDER — ALPRAZOLAM 0.5 MG PO TABS
0.5000 mg | ORAL_TABLET | Freq: Once | ORAL | Status: AC
  Administered 2021-06-18: 0.5 mg via ORAL
  Filled 2021-06-18: qty 1

## 2021-06-18 MED ORDER — ALPRAZOLAM 1 MG PO TABS: 1.0000 mg | ORAL_TABLET | Freq: Three times a day (TID) | ORAL | 0 refills | Status: AC | PRN

## 2021-06-18 NOTE — ED Provider Notes (Signed)
? ?[facility] ?Provider Note ? ? ? Event Date/Time  ? First MD Initiated Contact with Patient 06/18/21 1040   ?  (approximate) ? ? ?History  ? ?Panic Attack ? ? ?HPI ? ?Collin Jones is a 26 y.o. male presents to the ED with complaint of panic attack.  Patient states he woke up to get ready for school and felt like he was going to pass out.  Patient states he has no pain but was just scared.  He has had a chronic problem with anxiety.  He has appointments this month with a psychiatrist, PCP and therapist.  He has emailed each of these to see if he can be seen sooner for continued medication. ? ? ? ? ?Physical Exam  ? ?Triage Vital Signs: ?ED Triage Vitals  ?Enc Vitals Group  ?   BP 06/18/21 0950 108/75  ?   Pulse Rate 06/18/21 0950 (!) 145  ?   Resp 06/18/21 0950 18  ?   Temp 06/18/21 0950 98.1 ?F (36.7 ?C)  ?   Temp Source 06/18/21 1156 Oral  ?   SpO2 06/18/21 0950 100 %  ?   Weight 06/18/21 1018 141 lb 1.5 oz (64 kg)  ?   Height 06/18/21 1018 5\' 2"  (1.575 m)  ?   Head Circumference --   ?   Peak Flow --   ?   Pain Score 06/18/21 0948 0  ?   Pain Loc --   ?   Pain Edu? --   ?   Excl. in GC? --   ? ? ?Most recent vital signs: ?Vitals:  ? 06/18/21 0950 06/18/21 1156  ?BP: 108/75 122/81  ?Pulse: (!) 145 81  ?Resp: 18 16  ?Temp: 98.1 ?F (36.7 ?C) 98.1 ?F (36.7 ?C)  ?SpO2: 100% 99%  ? ? ? ?General: Awake, no distress.  Calm at present in a darkened room.  Family member at bedside. ?CV:  Good peripheral perfusion.  Heart regular rate and rhythm. ?Resp:  Normal effort.  Lungs are clear bilaterally.  Slow regular rate. ?Abd:  No distention.  ?Other:  Patient at present is calm.  He is discontinued hyperventilating as he was in triage.  He states that in the darkened room he was able to sleep which helped him calm down.  Patient is well-groomed and kept.  Talking in complete sentences without any difficulty. ? ? ?ED Results / Procedures / Treatments  ? ?Labs ?(all labs ordered are listed, but only  abnormal results are displayed) ?Labs Reviewed - No data to display ? ? ? ?PROCEDURES: ? ?Critical Care performed:  ? ?Procedures ? ? ?MEDICATIONS ORDERED IN ED: ?Medications  ?ALPRAZolam 08/18/21) tablet 0.5 mg (0.5 mg Oral Given 06/18/21 1153)  ? ? ? ?IMPRESSION / MDM / ASSESSMENT AND PLAN / ED COURSE  ?I reviewed the triage vital signs and the nursing notes. ? ? ?Differential diagnosis includes, but is not limited to, anxiety, panic attack. ? ?26 year old male presents to the ED while having a panic attack.  Initially in triage he was hyperventilating but at the time of the exam he had taken a nap and now was much calmer.  Patient has a history of anxiety and states that he has appointments with a PCP, therapist and a psychologist or psychiatrist in the later part of this month.  Patient has sent messages to each of these to see if he can be seen sooner.  Patient reports that he is not suicidal or  homicidal.  He reports that he has too much to live for and has plans for the future including a family.  Patient has a history of asthma, HIV and IBS.  He shares with me away from his family that he has been self medicating and has been buying the Xanax off the street to help with his anxiety.  He has not had any Xanax in the past week and most likely he is actually having some withdrawal along with his panic/anxiety.  He states that his family believes that he got a prescription for this medication and does not wish for them to know.  Patient was given Xanax 0.5 mg while in the ED and family is driving.  A prescription for 15 Xanax 1 mg 1 3 times daily as needed for panic attacks was sent to his pharmacy.  Patient is strongly encouraged to keep the appointments and to also try to see if he is able to get into see 1 of these providers sooner.  He was also given information about [facility] should he need to talk to someone before his appointment. ? ? ? ?  ? ? ?FINAL CLINICAL IMPRESSION(S) / ED DIAGNOSES  ? ?Final diagnoses:   ?Anxiety attack  ? ? ? ?Rx / DC Orders  ? ?ED Discharge Orders   ? ?      Ordered  ?  ALPRAZolam (XANAX) 1 MG tablet  3 times daily PRN       ? 06/18/21 1148  ? ?  ?  ? ?  ? ? ? ?Note:  This document was prepared using Dragon voice recognition software and may include unintentional dictation errors. ?  ?Tommi Rumps, PA-C ?06/18/21 1420 ? ?  ?Sharman Cheek, MD ?06/18/21 1530 ? ?

## 2021-06-18 NOTE — ED Notes (Signed)
Stepmother at bedside. ? ?Warm blankets provided, lights dimmed. Pt still crying. ?

## 2021-06-18 NOTE — ED Triage Notes (Addendum)
Pt comes with c/o panic attack. Pt state he woke up to get ready for school. Pt states he feels like he is going to pass out. Pt states no pain he is just so scared. Pt states xanax for last 7 months but not any in two days.  ? ?Respirations even and unlabored at this time. Pt denies any CP or SOb. ?
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
## 2021-06-18 NOTE — ED Notes (Signed)
Pt appears much calmer now. Pt verbalized that he feels much better. Not crying, vomiting or hyperventilating anymore. Family still at bedside with pt. ?

## 2021-06-18 NOTE — ED Notes (Signed)
Pt d/c home with mother per MD order. Mother is discharge ride home. Discharge summary reviewed, verbalize understanding. Ambulatory off unit. No s/s of acute distress noted at discharge.  ?

## 2021-06-18 NOTE — Discharge Instructions (Addendum)
Call or send a message via MyChart to your psychiatrist to see if you can be seen earlier.  Also [facility] may be available to you.  Information is listed on your discharge papers.  A prescription for Xanax 1 mg 3 times daily if needed for panic attacks was sent to the pharmacy.  This is a 5-day prescription.  Do not drive or operate machinery while taking this medication.  Return to the emergency department if any severe worsening of your symptoms, feelings of hurting yourself or others so that we can have you talk to some one in the emergency department. ?

## 2021-09-10 ENCOUNTER — Encounter: Payer: Self-pay | Admitting: Emergency Medicine

## 2021-09-10 ENCOUNTER — Other Ambulatory Visit: Payer: Self-pay

## 2021-09-10 ENCOUNTER — Emergency Department
Admission: EM | Admit: 2021-09-10 | Discharge: 2021-09-10 | Disposition: A | Discharge status: Home / Self Care | Payer: Self-pay | Attending: Emergency Medicine | Admitting: Emergency Medicine

## 2021-09-10 DIAGNOSIS — F410 Panic disorder [episodic paroxysmal anxiety] without agoraphobia: Secondary | ICD-10-CM | POA: Insufficient documentation

## 2021-09-10 DIAGNOSIS — F411 Generalized anxiety disorder: Secondary | ICD-10-CM | POA: Insufficient documentation

## 2021-09-10 LAB — COMPREHENSIVE METABOLIC PANEL
ALT: 16 U/L (ref 0–44)
AST: 15 U/L (ref 15–41)
Albumin: 4.7 g/dL (ref 3.5–5.0)
Alkaline Phosphatase: 60 U/L (ref 38–126)
Anion gap: 9 (ref 5–15)
BUN: 12 mg/dL (ref 6–20)
CO2: 23 mmol/L (ref 22–32)
Calcium: 9.7 mg/dL (ref 8.9–10.3)
Chloride: 107 mmol/L (ref 98–111)
Creatinine, Ser: 1 mg/dL (ref 0.61–1.24)
GFR, Estimated: >60 mL/min (ref >60)
Glucose, Bld: 94 mg/dL (ref 70–99)
Potassium: 3.9 mmol/L (ref 3.5–5.1)
Sodium: 139 mmol/L (ref 135–145)
Total Bilirubin: 0.8 mg/dL (ref 0.3–1.2)
Total Protein: 8.2 g/dL — ABNORMAL HIGH (ref 6.5–8.1)

## 2021-09-10 LAB — CBC WITH DIFFERENTIAL/PLATELET
Abs Immature Granulocytes: 0.04 10*3/uL (ref 0.00–0.07)
Basophils Absolute: 0.1 10*3/uL (ref 0.0–0.1)
Basophils Relative: 1 %
Eosinophils Absolute: 0.3 10*3/uL (ref 0.0–0.5)
Eosinophils Relative: 3 %
HCT: 48.5 % (ref 39.0–52.0)
Hemoglobin: 16.3 g/dL (ref 13.0–17.0)
Immature Granulocytes: 0 %
Lymphocytes Relative: 18 %
Lymphs Abs: 1.9 10*3/uL (ref 0.7–4.0)
MCH: 29.7 pg (ref 26.0–34.0)
MCHC: 33.6 g/dL (ref 30.0–36.0)
MCV: 88.3 fL (ref 80.0–100.0)
Monocytes Absolute: 0.8 10*3/uL (ref 0.1–1.0)
Monocytes Relative: 8 %
Neutro Abs: 7.2 10*3/uL (ref 1.7–7.7)
Neutrophils Relative %: 70 %
Platelets: 187 10*3/uL (ref 150–400)
RBC: 5.49 MIL/uL (ref 4.22–5.81)
RDW: 12.8 % (ref 11.5–15.5)
WBC: 10.2 10*3/uL (ref 4.0–10.5)
nRBC: 0 % (ref 0.0–0.2)

## 2021-09-10 MED ORDER — HYDROXYZINE PAMOATE 100 MG PO CAPS: 100.0000 mg | ORAL_CAPSULE | Freq: Three times a day (TID) | ORAL | 2 refills | Status: DC | PRN

## 2021-10-08 ENCOUNTER — Encounter: Payer: Self-pay | Admitting: Emergency Medicine

## 2021-10-08 ENCOUNTER — Other Ambulatory Visit: Payer: Self-pay

## 2021-10-08 ENCOUNTER — Emergency Department
Admission: EM | Admit: 2021-10-08 | Discharge: 2021-10-08 | Disposition: A | Discharge status: Home / Self Care | Payer: Self-pay | Attending: Emergency Medicine | Admitting: Emergency Medicine

## 2021-10-08 DIAGNOSIS — J45909 Unspecified asthma, uncomplicated: Secondary | ICD-10-CM | POA: Insufficient documentation

## 2021-10-08 DIAGNOSIS — F419 Anxiety disorder, unspecified: Secondary | ICD-10-CM | POA: Insufficient documentation

## 2021-10-08 DIAGNOSIS — Z21 Asymptomatic human immunodeficiency virus [HIV] infection status: Secondary | ICD-10-CM | POA: Insufficient documentation

## 2021-10-08 LAB — CBC
HCT: 48.6 % (ref 39.0–52.0)
Hemoglobin: 16.5 g/dL (ref 13.0–17.0)
MCH: 29.2 pg (ref 26.0–34.0)
MCHC: 34 g/dL (ref 30.0–36.0)
MCV: 85.9 fL (ref 80.0–100.0)
Platelets: 179 10*3/uL (ref 150–400)
RBC: 5.66 MIL/uL (ref 4.22–5.81)
RDW: 12.9 % (ref 11.5–15.5)
WBC: 10.4 10*3/uL (ref 4.0–10.5)
nRBC: 0 % (ref 0.0–0.2)

## 2021-10-08 LAB — ACETAMINOPHEN LEVEL: Acetaminophen (Tylenol), Serum: <10 ug/mL — ABNORMAL LOW (ref 10–30)

## 2021-10-08 LAB — URINE DRUG SCREEN, QUALITATIVE (ARMC ONLY)
Amphetamines, Ur Screen: NOT DETECTED
Barbiturates, Ur Screen: NOT DETECTED
Benzodiazepine, Ur Scrn: POSITIVE — AB
Cannabinoid 50 Ng, Ur ~~LOC~~: POSITIVE — AB
Cocaine Metabolite,Ur ~~LOC~~: NOT DETECTED
MDMA (Ecstasy)Ur Screen: NOT DETECTED
Methadone Scn, Ur: NOT DETECTED
Opiate, Ur Screen: NOT DETECTED
Phencyclidine (PCP) Ur S: NOT DETECTED
Tricyclic, Ur Screen: NOT DETECTED

## 2021-10-08 LAB — COMPREHENSIVE METABOLIC PANEL
ALT: 15 U/L (ref 0–44)
AST: 16 U/L (ref 15–41)
Albumin: 5 g/dL (ref 3.5–5.0)
Alkaline Phosphatase: 61 U/L (ref 38–126)
Anion gap: 9 (ref 5–15)
BUN: 12 mg/dL (ref 6–20)
CO2: 22 mmol/L (ref 22–32)
Calcium: 10.1 mg/dL (ref 8.9–10.3)
Chloride: 107 mmol/L (ref 98–111)
Creatinine, Ser: 1.09 mg/dL (ref 0.61–1.24)
GFR, Estimated: >60 mL/min (ref >60)
Glucose, Bld: 92 mg/dL (ref 70–99)
Potassium: 3.5 mmol/L (ref 3.5–5.1)
Sodium: 138 mmol/L (ref 135–145)
Total Bilirubin: 1.5 mg/dL — ABNORMAL HIGH (ref 0.3–1.2)
Total Protein: 8.2 g/dL — ABNORMAL HIGH (ref 6.5–8.1)

## 2021-10-08 LAB — ETHANOL: Alcohol, Ethyl (B): <10 mg/dL (ref <10)

## 2021-10-08 LAB — SALICYLATE LEVEL: Salicylate Lvl: <7 mg/dL — ABNORMAL LOW (ref 7.0–30.0)

## 2021-10-08 MED ORDER — ONDANSETRON HCL 4 MG PO TABS: 4.0000 mg | ORAL_TABLET | Freq: Every day | ORAL | 0 refills | Status: AC | PRN

## 2021-10-08 MED ORDER — ONDANSETRON 4 MG PO TBDP
4.0000 mg | ORAL_TABLET | Freq: Once | ORAL | Status: AC
  Administered 2021-10-08: 4 mg via ORAL
  Filled 2021-10-08: qty 1

## 2021-10-08 MED ORDER — ALPRAZOLAM 0.5 MG PO TABS
0.5000 mg | ORAL_TABLET | Freq: Once | ORAL | Status: AC
  Administered 2021-10-08: 0.5 mg via ORAL
  Filled 2021-10-08: qty 1

## 2021-10-08 NOTE — ED Provider Notes (Signed)
[facility] Provider Note    Event Date/Time   First MD Initiated Contact with Patient 10/08/21 1931     (approximate)   History   Panic Attack   HPI  Collin Jones is a 26 y.o. male past medical history of asthma HIV IBS presents after panic attack.  Patient tells me his anxiety has been acting up for the last several days.  He has been trying to come off alprazolam because he was previously taking this frequently.  He took 1/4 pill today.  No longer drinking alcohol either.  Denies other drugs other than occasional marijuana use.  Says that he feels extremely anxious at home does not want to be alone is not sleeping well because he is afraid he is going to die and not wake up.  Also feels very anxious when he eats and drinks because he feels overstimulated by the taste and smell.  Having frequent vomiting as a result.  Denies chest pain shortness of breath.  No headaches visual change numbness tingling weakness.  Patient is followed at [facility].  He is only been never prescribed benzos and not SSRI.  He went off of his HIV medication for about a week started this back up a week ago.  Last CD4 count was normal.    Past Medical History:  Diagnosis Date   Asthma    HIV (human immunodeficiency virus infection) (HCC)    IBS (irritable bowel syndrome)     There are no problems to display for this patient.    Physical Exam  Triage Vital Signs: ED Triage Vitals [10/08/21 1554]  Enc Vitals Group     BP (!) 125/91     Pulse Rate 74     Resp 18     Temp 98.7 F (37.1 C)     Temp Source Oral     SpO2 98 %     Weight 164 lb (74.4 kg)     Height 5\' 3"  (1.6 m)     Head Circumference      Peak Flow      Pain Score 0     Pain Loc      Pain Edu?      Excl. in GC?     Most recent vital signs: Vitals:   10/08/21 2002 10/08/21 2004  BP: (!) 139/95   Pulse: 80   Resp: (!) 22   Temp:  98.6 F (37 C)  SpO2: 97%      General: Awake, patient is tearful  hyperventilating CV:  Good peripheral perfusion.  Resp:  Normal effort.  Abd:  No distention.  Neuro:             Awake, Alert, Oriented x 3  Other:  No suicidal ideation   ED Results / Procedures / Treatments  Labs (all labs ordered are listed, but only abnormal results are displayed) Labs Reviewed  COMPREHENSIVE METABOLIC PANEL - Abnormal; Notable for the following components:      Result Value   Total Protein 8.2 (*)    Total Bilirubin 1.5 (*)    All other components within normal limits  SALICYLATE LEVEL - Abnormal; Notable for the following components:   Salicylate Lvl <7.0 (*)    All other components within normal limits  ACETAMINOPHEN LEVEL - Abnormal; Notable for the following components:   Acetaminophen (Tylenol), Serum <10 (*)    All other components within normal limits  URINE DRUG SCREEN, QUALITATIVE ([facility] ONLY) - Abnormal; Notable  for the following components:   Cannabinoid 50 Ng, Ur [place] POSITIVE (*)    Benzodiazepine, Ur Scrn POSITIVE (*)    All other components within normal limits  ETHANOL  CBC     EKG     RADIOLOGY    PROCEDURES:  Critical Care performed: No  Procedures    MEDICATIONS ORDERED IN ED: Medications  ALPRAZolam (XANAX) tablet 0.5 mg (0.5 mg Oral Given 10/08/21 2001)  ondansetron (ZOFRAN-ODT) disintegrating tablet 4 mg (4 mg Oral Given 10/08/21 2001)     IMPRESSION / MDM / ASSESSMENT AND PLAN / ED COURSE  I reviewed the triage vital signs and the nursing notes.                              Patient's presentation is most consistent with severe exacerbation of chronic illness.  Differential diagnosis includes, but is not limited to, exacerbation of generalized anxiety disorder, medication side effect, encephalitis, meningitis less likely  The patient is a 26 year old male with history of HIV anxiety on benzos chronically presents today with exacerbation of anxiety.  Feels like he has been in a chronic panic attack since yesterday  as and having difficulty sleeping because of fear of dying not wanting to be alone.  Has been trying to come off the benzos just taking small doses about 1/4 pill at a time.  He has been feeling very anxious with eating specifically and has frequent emesis.  Denies abdominal pain.  Patient initially looks calm and I am in the room however he does have an episode of panic where he is hyperventilating crying says that he feels numb all over likely due to hyperventilation.  He is able to stop with just deep breathing.  He is accompanied by his mother who provides support.  He is not suicidal.  I had a long discussion with him about the need for maintenance anxiety medication such as SSRIs as opposed to benzos and he agrees he does not want to be on the benzos chronically.  However he likely needs to be tapered off and I think during this acute episode he would benefit from a benzo and he agreed.  He was given 0.5 mg of alprazolam and Zofran after which she looked much improved was, able to tolerate p.o. without emesis.  I encouraged him to call with [facility] provider tomorrow to discuss SSRI or other maintenance anxiety medications.  He has adequate social support is not suicidal and I do not think he requires inpatient treatment or IVC at this time.  His lab work is all reassuring.  Has no new neurologic symptoms such as just complication of HIV and his last CD4 count was normal so he is at low risk for things such as cryptococcus thank you for needs further work-up at this time.       FINAL CLINICAL IMPRESSION(S) / ED DIAGNOSES   Final diagnoses:  Anxiety     Rx / DC Orders   ED Discharge Orders          Ordered    ondansetron (ZOFRAN) 4 MG tablet  Daily PRN        10/08/21 2037             Note:  This document was prepared using Dragon voice recognition software and may include unintentional dictation errors.   Georga Hacking, MD 10/08/21 2043

## 2021-10-08 NOTE — Discharge Instructions (Addendum)
Please call your provider at [facility] tomorrow to discuss being started on an SSRI. I have wirtten you a prescription for Zofran for nausea and vomiting as needed.

## 2021-10-08 NOTE — ED Triage Notes (Signed)
Patient to ED via POV for an anxiety attack. Patient states he started his HIV medications last week. Patient states he hasn't been sleeping and feels scared at times. Patient states he has been under a lot of stress. Patient states he took a 1/4 of a xanax yesterday. Patient states he has lightlessness, shaking, and vomiting.

## 2021-11-08 ENCOUNTER — Emergency Department
Admission: EM | Admit: 2021-11-08 | Discharge: 2021-11-08 | Disposition: A | Discharge status: Home / Self Care | Payer: No Typology Code available for payment source | Attending: Emergency Medicine | Admitting: Emergency Medicine

## 2021-11-08 ENCOUNTER — Encounter: Payer: Self-pay | Admitting: *Deleted

## 2021-11-08 ENCOUNTER — Other Ambulatory Visit: Payer: Self-pay

## 2021-11-08 ENCOUNTER — Emergency Department: Payer: No Typology Code available for payment source

## 2021-11-08 DIAGNOSIS — T07XXXA Unspecified multiple injuries, initial encounter: Secondary | ICD-10-CM

## 2021-11-08 DIAGNOSIS — F419 Anxiety disorder, unspecified: Secondary | ICD-10-CM | POA: Insufficient documentation

## 2021-11-08 DIAGNOSIS — Y92410 Unspecified street and highway as the place of occurrence of the external cause: Secondary | ICD-10-CM | POA: Insufficient documentation

## 2021-11-08 DIAGNOSIS — T148XXA Other injury of unspecified body region, initial encounter: Secondary | ICD-10-CM

## 2021-11-08 DIAGNOSIS — R55 Syncope and collapse: Secondary | ICD-10-CM | POA: Insufficient documentation

## 2021-11-08 LAB — URINALYSIS, ROUTINE W REFLEX MICROSCOPIC
Bilirubin Urine: NEGATIVE
Glucose, UA: NEGATIVE mg/dL
Hgb urine dipstick: NEGATIVE
Ketones, ur: NEGATIVE mg/dL
Leukocytes,Ua: NEGATIVE
Nitrite: NEGATIVE
Protein, ur: NEGATIVE mg/dL
Specific Gravity, Urine: 1.006 (ref 1.005–1.030)
pH: 7 (ref 5.0–8.0)

## 2021-11-08 LAB — BASIC METABOLIC PANEL
Anion gap: 5 (ref 5–15)
BUN: 11 mg/dL (ref 6–20)
CO2: 22 mmol/L (ref 22–32)
Calcium: 9.4 mg/dL (ref 8.9–10.3)
Chloride: 111 mmol/L (ref 98–111)
Creatinine, Ser: 0.96 mg/dL (ref 0.61–1.24)
GFR, Estimated: >60 mL/min (ref >60)
Glucose, Bld: 102 mg/dL — ABNORMAL HIGH (ref 70–99)
Potassium: 3.5 mmol/L (ref 3.5–5.1)
Sodium: 138 mmol/L (ref 135–145)

## 2021-11-08 LAB — CBC WITH DIFFERENTIAL/PLATELET
Abs Immature Granulocytes: 0.02 10*3/uL (ref 0.00–0.07)
Basophils Absolute: 0 10*3/uL (ref 0.0–0.1)
Basophils Relative: 1 %
Eosinophils Absolute: 0.3 10*3/uL (ref 0.0–0.5)
Eosinophils Relative: 5 %
HCT: 42.3 % (ref 39.0–52.0)
Hemoglobin: 14.3 g/dL (ref 13.0–17.0)
Immature Granulocytes: 0 %
Lymphocytes Relative: 23 %
Lymphs Abs: 1.5 10*3/uL (ref 0.7–4.0)
MCH: 29.5 pg (ref 26.0–34.0)
MCHC: 33.8 g/dL (ref 30.0–36.0)
MCV: 87.2 fL (ref 80.0–100.0)
Monocytes Absolute: 0.6 10*3/uL (ref 0.1–1.0)
Monocytes Relative: 10 %
Neutro Abs: 3.9 10*3/uL (ref 1.7–7.7)
Neutrophils Relative %: 61 %
Platelets: 170 10*3/uL (ref 150–400)
RBC: 4.85 MIL/uL (ref 4.22–5.81)
RDW: 13.1 % (ref 11.5–15.5)
WBC: 6.4 10*3/uL (ref 4.0–10.5)
nRBC: 0 % (ref 0.0–0.2)

## 2021-11-08 MED ORDER — BACLOFEN 10 MG PO TABS: 10.0000 mg | ORAL_TABLET | Freq: Three times a day (TID) | ORAL | 0 refills | Status: AC

## 2021-11-08 MED ORDER — ACETAMINOPHEN 325 MG PO TABS
650.0000 mg | ORAL_TABLET | Freq: Once | ORAL | Status: AC
  Administered 2021-11-08: 650 mg via ORAL
  Filled 2021-11-08: qty 2

## 2021-11-08 MED ORDER — MELOXICAM 15 MG PO TABS: 15.0000 mg | ORAL_TABLET | Freq: Every day | ORAL | 2 refills | Status: AC

## 2021-11-08 MED ORDER — IOHEXOL 300 MG/ML  SOLN
100.0000 mL | Freq: Once | INTRAMUSCULAR | Status: AC | PRN
  Administered 2021-11-08: 100 mL via INTRAVENOUS

## 2021-11-08 NOTE — ED Notes (Addendum)
Pt verbalized understanding of discharge instructions and follow-up care instructions. Pt advised if symptoms worsen to return to ED.  

## 2021-11-08 NOTE — Discharge Instructions (Signed)
Take the meloxicam for 1 week only, baclofen for muscle pain for 1 week only Apply ice to any areas that hurt Return if worsening
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
## 2021-11-08 NOTE — ED Provider Notes (Signed)
  Physical Exam  BP 113/81 (BP Location: Left Arm)   Pulse 77   Temp 98.5 F (36.9 C) (Oral)   Resp 17   Ht 5\' 4"  (1.626 m)   Wt 74.4 kg   SpO2 94%   BMI 28.15 kg/m   Physical Exam patient appears to be stable  Procedures  Procedures  ED Course / MDM    Medical Decision Making Amount and/or Complexity of Data Reviewed Labs: ordered. Radiology: ordered.  Risk OTC drugs. Prescription drug management.   CT of the head, C-spine, and abdomen/pelvis were independently reviewed and interpreted by me as being negative.  Did discuss all of this with the patient.  He was instructed to avoid taking Xanax and baclofen at the same time.  He is only take meloxicam for 1 week due to chance of renal toxicity with his HIV meds.  Patient is in agreement with this treatment plan.  He is apply ice to all areas that hurt.  Return emergency department worsening.  Follow-up with orthopedics if not improving in 1 week.  Discharged stable condition.       , PA-C 11/08/21 1628    11/10/21, MD 11/08/21 [phone]
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
## 2022-04-15 ENCOUNTER — Encounter: Payer: Self-pay | Admitting: *Deleted

## 2022-04-15 ENCOUNTER — Other Ambulatory Visit: Payer: Self-pay

## 2022-04-15 ENCOUNTER — Emergency Department
Admission: EM | Admit: 2022-04-15 | Discharge: 2022-04-15 | Disposition: A | Discharge status: Home / Self Care | Payer: Self-pay | Attending: Emergency Medicine | Admitting: Emergency Medicine

## 2022-04-15 ENCOUNTER — Emergency Department: Payer: Self-pay

## 2022-04-15 DIAGNOSIS — F419 Anxiety disorder, unspecified: Secondary | ICD-10-CM | POA: Insufficient documentation

## 2022-04-15 DIAGNOSIS — Z21 Asymptomatic human immunodeficiency virus [HIV] infection status: Secondary | ICD-10-CM | POA: Insufficient documentation

## 2022-04-15 DIAGNOSIS — E872 Acidosis, unspecified: Secondary | ICD-10-CM | POA: Insufficient documentation

## 2022-04-15 LAB — CBC
HCT: 50.5 % (ref 39.0–52.0)
Hemoglobin: 16.6 g/dL (ref 13.0–17.0)
MCH: 29.2 pg (ref 26.0–34.0)
MCHC: 32.9 g/dL (ref 30.0–36.0)
MCV: 88.8 fL (ref 80.0–100.0)
Platelets: 220 10*3/uL (ref 150–400)
RBC: 5.69 MIL/uL (ref 4.22–5.81)
RDW: 13.6 % (ref 11.5–15.5)
WBC: 13 10*3/uL — ABNORMAL HIGH (ref 4.0–10.5)
nRBC: 0 % (ref 0.0–0.2)

## 2022-04-15 LAB — BASIC METABOLIC PANEL
Anion gap: 17 — ABNORMAL HIGH (ref 5–15)
BUN: 12 mg/dL (ref 6–20)
CO2: 18 mmol/L — ABNORMAL LOW (ref 22–32)
Calcium: 10 mg/dL (ref 8.9–10.3)
Chloride: 101 mmol/L (ref 98–111)
Creatinine, Ser: 0.94 mg/dL (ref 0.61–1.24)
GFR, Estimated: >60 mL/min (ref >60)
Glucose, Bld: 84 mg/dL (ref 70–99)
Potassium: 3.6 mmol/L (ref 3.5–5.1)
Sodium: 136 mmol/L (ref 135–145)

## 2022-04-15 LAB — TROPONIN I (HIGH SENSITIVITY): Troponin I (High Sensitivity): <2 ng/L (ref <18)

## 2022-04-15 NOTE — Discharge Instructions (Signed)
Continue taking your normal medications as prescribed and follow-up with your mental health and primary care providers.  Return to the ER for new, worsening, or persistent severe anxiety, palpitations, chest pain, difficulty breathing, any thoughts of wanting to harm yourself or anyone else, or any other new or worsening symptoms that concern you.

## 2022-04-15 NOTE — ED Triage Notes (Addendum)
Pt crying.  Pt reports he has anxiety, sob and feels like he is going to pass out.  Pt took part of xanax this am and noon today.  Pt feels lightheaded all over. Pt alert.      Hx severe anxiety, HIV.  Pt denies SI or HI.  Pt denies etoh use today.

## 2022-04-15 NOTE — ED Provider Notes (Signed)
[facility] Provider Note    Event Date/Time   First MD Initiated Contact with Patient 04/15/22 1634     (approximate)   History   Anxiety and Shortness of Breath   HPI  Collin Jones is a 27 y.o. male with a history of anxiety and HIV who presents with what he describes as a panic attack.  Patient states that he has been increasingly anxious recently.  He is prescribed alprazolam but prefers not to take it unless he absolutely has to.  He stated that he has been under increased stress and was feeling more anxious today.  He took two 0.25 mg doses of alprazolam earlier today and when they wore off the anxiety  Acute anxiety, persisting all day today despite taking Xanax, now resolved.  Had some palpitations and chest discomfort around the time he arrived to the ED and had blood drawn, these are now resolved as well, no SI HI  I reviewed the past medical records.  The patient's most recent outpatient encounter was at [facility] for COVID test last November.  He was most recently seen by his LCSW at [place] in August of last year for anxiety.   Physical Exam   Triage Vital Signs: ED Triage Vitals  Enc Vitals Group     BP 04/15/22 1511 (!) 158/101     Pulse Rate 04/15/22 1511 (!) 140     Resp 04/15/22 1511 (!) 24     Temp 04/15/22 1511 98 F (36.7 C)     Temp Source 04/15/22 1511 Oral     SpO2 04/15/22 1511 100 %     Weight 04/15/22 1512 164 lb (74.4 kg)     Height 04/15/22 1512 5\' 4"  (1.626 m)     Head Circumference --      Peak Flow --      Pain Score 04/15/22 1511 0     Pain Loc --      Pain Edu? --      Excl. in [place]? --     Most recent vital signs: Vitals:   04/15/22 1631 04/15/22 1639  BP:  127/83  Pulse:  95  Resp:  20  Temp:  98 F (36.7 C)  SpO2: 100% 99%     General: Awake, no distress.  CV:  Good peripheral perfusion.  Normal heart sounds. Resp:  Normal effort.  Lungs CTAB. Abd:  No distention.  Other:  No peripheral edema.  Calm  and cooperative.   ED Results / Procedures / Treatments   Labs (all labs ordered are listed, but only abnormal results are displayed) Labs Reviewed  BASIC METABOLIC PANEL - Abnormal; Notable for the following components:      Result Value   CO2 18 (*)    Anion gap 17 (*)    All other components within normal limits  CBC - Abnormal; Notable for the following components:   WBC 13.0 (*)    All other components within normal limits  TROPONIN I (HIGH SENSITIVITY)     EKG  ED ECG REPORT I, Arta Silence, the attending physician, personally viewed and interpreted this ECG.  Date: 04/15/2022 EKG Time: 1514 Rate: 113 Rhythm: Sinus tachycardia QRS Axis: normal Intervals: normal ST/T Wave abnormalities: Nonspecific T wave abnormalities, likely rate related Narrative Interpretation: no evidence of acute ischemia    RADIOLOGY  Chest x-ray: I independently viewed and interpreted the images; there is no focal consolidation or edema  PROCEDURES:  Critical Care performed: No  Procedures   MEDICATIONS ORDERED IN ED: Medications - No data to display   IMPRESSION / MDM / [place] / ED COURSE  I reviewed the triage vital signs and the nursing notes.  28 year old male with PMH as noted above presents with acute anxiety/panic, now resolved.  The patient had associated tachycardia and some palpitations and shortness of breath but these have resolved as well.  He denies SI or HI.  He is compliant with his medications.  He is trying to get switched from alprazolam to an SSRI and has a psychiatrist he has been followin with  Differential diagnosis includes, but is not limited to, acute anxiety, noise anxiety disorder, panic attack.  The patient denies drug use.  There is no evidence of primary cardiac etiology.  EKG is nonischemic.  Chest x-ray shows no acute findings.  At this time, the patient's symptoms have resolved.  He feels comfortable and like to go home.  Lab  workup is unremarkable with a negative troponin, no anemia, and normal electrolytes.  Anion gap was elevated on the initial labs although I suspect that this is due to hyperventilation and possible dehydration.  There is no indication for repeat labs.  Patient's presentation is most consistent with acute presentation with potential threat to life or bodily function.  The patient is on the cardiac monitor to evaluate for evidence of arrhythmia and/or significant heart rate changes.  At this time, the patient is stable for discharge home.  I counseled him on the results of the workup.  He will follow-up with his psychiatrist.  Return precautions given, and he expresses understanding.   FINAL CLINICAL IMPRESSION(S) / ED DIAGNOSES   Final diagnoses:  Anxiety     Rx / DC Orders   ED Discharge Orders     None        Note:  This document was prepared using Dragon voice recognition software and may include unintentional dictation errors.    Arta Silence, MD 04/15/22 2154

## 2022-04-15 NOTE — ED Notes (Signed)
See triage note. Pt came in for a panic attack. Pt still shaky and nervous but calmed down more since triage but stating he's very scared. States he is not a fan of his antianxiety medication due to the way it makes him feel.

## 2022-09-28 ENCOUNTER — Other Ambulatory Visit: Payer: Self-pay

## 2022-09-28 ENCOUNTER — Emergency Department
Admission: EM | Admit: 2022-09-28 | Discharge: 2022-09-28 | Disposition: A | Discharge status: Home / Self Care | Payer: Self-pay | Attending: Emergency Medicine | Admitting: Emergency Medicine

## 2022-09-28 ENCOUNTER — Encounter: Payer: Self-pay | Admitting: Intensive Care

## 2022-09-28 DIAGNOSIS — F410 Panic disorder [episodic paroxysmal anxiety] without agoraphobia: Secondary | ICD-10-CM | POA: Insufficient documentation

## 2022-09-28 DIAGNOSIS — F419 Anxiety disorder, unspecified: Secondary | ICD-10-CM

## 2022-09-28 DIAGNOSIS — Z21 Asymptomatic human immunodeficiency virus [HIV] infection status: Secondary | ICD-10-CM | POA: Insufficient documentation

## 2022-09-28 DIAGNOSIS — Y909 Presence of alcohol in blood, level not specified: Secondary | ICD-10-CM | POA: Insufficient documentation

## 2022-09-28 DIAGNOSIS — J45909 Unspecified asthma, uncomplicated: Secondary | ICD-10-CM | POA: Insufficient documentation

## 2022-09-28 DIAGNOSIS — F1010 Alcohol abuse, uncomplicated: Secondary | ICD-10-CM | POA: Insufficient documentation

## 2022-09-28 HISTORY — DX: Anxiety disorder, unspecified: F41.9

## 2022-09-28 LAB — URINE DRUG SCREEN, QUALITATIVE (ARMC ONLY)
Amphetamines, Ur Screen: NOT DETECTED
Barbiturates, Ur Screen: NOT DETECTED
Benzodiazepine, Ur Scrn: POSITIVE — AB
Cannabinoid 50 Ng, Ur ~~LOC~~: POSITIVE — AB
Cocaine Metabolite,Ur ~~LOC~~: NOT DETECTED
MDMA (Ecstasy)Ur Screen: NOT DETECTED
Methadone Scn, Ur: NOT DETECTED
Opiate, Ur Screen: NOT DETECTED
Phencyclidine (PCP) Ur S: NOT DETECTED
Tricyclic, Ur Screen: NOT DETECTED

## 2022-09-28 LAB — CBC
HCT: 50.5 % (ref 39.0–52.0)
Hemoglobin: 17.3 g/dL — ABNORMAL HIGH (ref 13.0–17.0)
MCH: 30.6 pg (ref 26.0–34.0)
MCHC: 34.3 g/dL (ref 30.0–36.0)
MCV: 89.4 fL (ref 80.0–100.0)
Platelets: 272 10*3/uL (ref 150–400)
RBC: 5.65 MIL/uL (ref 4.22–5.81)
RDW: 14.4 % (ref 11.5–15.5)
WBC: 12.8 10*3/uL — ABNORMAL HIGH (ref 4.0–10.5)
nRBC: 0 % (ref 0.0–0.2)

## 2022-09-28 LAB — COMPREHENSIVE METABOLIC PANEL
ALT: 32 U/L (ref 0–44)
AST: 41 U/L (ref 15–41)
Albumin: 5.5 g/dL — ABNORMAL HIGH (ref 3.5–5.0)
Alkaline Phosphatase: 64 U/L (ref 38–126)
Anion gap: 20 — ABNORMAL HIGH (ref 5–15)
BUN: 9 mg/dL (ref 6–20)
CO2: 16 mmol/L — ABNORMAL LOW (ref 22–32)
Calcium: 9.8 mg/dL (ref 8.9–10.3)
Chloride: 101 mmol/L (ref 98–111)
Creatinine, Ser: 0.93 mg/dL (ref 0.61–1.24)
GFR, Estimated: >60 mL/min (ref >60)
Glucose, Bld: 89 mg/dL (ref 70–99)
Potassium: 3.3 mmol/L — ABNORMAL LOW (ref 3.5–5.1)
Sodium: 137 mmol/L (ref 135–145)
Total Bilirubin: 1.8 mg/dL — ABNORMAL HIGH (ref 0.3–1.2)
Total Protein: 8.7 g/dL — ABNORMAL HIGH (ref 6.5–8.1)

## 2022-09-28 LAB — SALICYLATE LEVEL: Salicylate Lvl: <7 mg/dL — ABNORMAL LOW (ref 7.0–30.0)

## 2022-09-28 LAB — ETHANOL: Alcohol, Ethyl (B): <10 mg/dL (ref <10)

## 2022-09-28 LAB — ACETAMINOPHEN LEVEL: Acetaminophen (Tylenol), Serum: <10 ug/mL — ABNORMAL LOW (ref 10–30)

## 2022-09-28 MED ORDER — ONDANSETRON 4 MG PO TBDP: 4.0000 mg | ORAL_TABLET | Freq: Three times a day (TID) | ORAL | 0 refills | Status: DC | PRN

## 2022-09-28 NOTE — ED Provider Notes (Signed)
[facility] Provider Note    Event Date/Time   First MD Initiated Contact with Patient 09/28/22 2043     (approximate)   History   Chief Complaint Panic Attack and Hand Pain (bilateral)   HPI  Collin Jones is a 27 y.o. male with past medical history of HIV, asthma, and anxiety who presents to the ED complaining of panic attack.  Patient reports that he has been feeling increasingly anxious over the past couple of days and had a panic attack just prior to arrival.  He states he has been feeling very shaky since then and that he had a spasm in the muscles of both hands.  He continues to feel shaky at the time of my assessment but hand spasms have improved.  He has not had any chest pain or shortness of breath.  He does state that he takes Xanax chronically for anxiety, has not missed any doses.  He additionally states he has been drinking heavily recently, about 1/5 of liquor per day with his last drink coming yesterday.  He denies history of alcohol withdrawal or seizures.  He does state he has not been eating or drinking much recently due to anxiety, denies vomiting or abdominal pain but has occasionally felt nauseous.     Physical Exam   Triage Vital Signs: ED Triage Vitals [09/28/22 1357]  Encounter Vitals Group     BP (!) 137/92     Systolic BP Percentile      Diastolic BP Percentile      Pulse Rate (!) 130     Resp 20     Temp 98 F (36.7 C)     Temp Source Oral     SpO2 97 %     Weight 160 lb (72.6 kg)     Height 5\' 3"  (1.6 m)     Head Circumference      Peak Flow      Pain Score 10     Pain Loc      Pain Education      Exclude from Growth Chart     Most recent vital signs: Vitals:   09/28/22 1818 09/28/22 1819  BP: 137/70   Pulse: 91   Resp: 20   Temp:  98.2 F (36.8 C)  SpO2: 100%     Constitutional: Alert and oriented. Eyes: Conjunctivae are normal. Head: Atraumatic. Nose: No congestion/rhinnorhea. Mouth/Throat: Mucous  membranes are moist.  Cardiovascular: Normal rate, regular rhythm. Grossly normal heart sounds.  2+ radial pulses bilaterally. Respiratory: Normal respiratory effort.  No retractions. Lungs CTAB. Gastrointestinal: Soft and nontender. No distention. Musculoskeletal: No lower extremity tenderness nor edema.  Neurologic:  Normal speech and language. No gross focal neurologic deficits are appreciated.    ED Results / Procedures / Treatments   Labs (all labs ordered are listed, but only abnormal results are displayed) Labs Reviewed  COMPREHENSIVE METABOLIC PANEL - Abnormal; Notable for the following components:      Result Value   Potassium 3.3 (*)    CO2 16 (*)    Total Protein 8.7 (*)    Albumin 5.5 (*)    Total Bilirubin 1.8 (*)    Anion gap 20 (*)    All other components within normal limits  SALICYLATE LEVEL - Abnormal; Notable for the following components:   Salicylate Lvl <7.0 (*)    All other components within normal limits  ACETAMINOPHEN LEVEL - Abnormal; Notable for the following components:   Acetaminophen (Tylenol),  Serum <10 (*)    All other components within normal limits  CBC - Abnormal; Notable for the following components:   WBC 12.8 (*)    Hemoglobin 17.3 (*)    All other components within normal limits  URINE DRUG SCREEN, QUALITATIVE ([facility] ONLY) - Abnormal; Notable for the following components:   Cannabinoid 50 Ng, Ur [place] POSITIVE (*)    Benzodiazepine, Ur Scrn POSITIVE (*)    All other components within normal limits  ETHANOL    PROCEDURES:  Critical Care performed: No  Procedures   MEDICATIONS ORDERED IN ED: Medications - No data to display   IMPRESSION / MDM / ASSESSMENT AND PLAN / ED COURSE  I reviewed the triage vital signs and the nursing notes.                              27 y.o. male with past medical history of HIV, asthma, and anxiety who presents to the ED complaining of increased anxiety and panic attack along with recent alcohol  consumption.  Patient's presentation is most consistent with acute presentation with potential threat to life or bodily function.  Differential diagnosis includes, but is not limited to, anxiety, panic attack, depression, psychosis, electrode abnormality, AKI, anemia, dehydration, alcohol withdrawal.  Patient nontoxic-appearing and in no acute distress, vital signs are unremarkable.  He reports significant anxiety and has been drinking alcohol regularly to compensate for this, last drink was yesterday but he does not seem to have significant alcohol withdrawal given reassuring vital signs and no tremors or tongue fasciculations.  Labs do show anion gap metabolic acidosis, which is likely due to alcoholic ketoacidosis given patient's decreased oral intake.  He has mild elevation in his bilirubin also likely due to alcohol consumption.  Mild leukocytosis noted but no significant anemia and no findings concerning for infection.  Patient reports being compliant with his HIV medication.  He is primarily requesting help with his anxiety, already takes Xanax regularly and was counseled that he may benefit from SSRI as well as outpatient counseling.  He denies any suicidal or homicidal ideation, declines psychiatric evaluation here in the ED.  He is appropriate for outpatient management, was counseled to drink plenty of fluids and will be prescribed Zofran for use for nausea as needed.  He will be provided with resources for mental health as well as referral to PCP, was counseled to return to the ED for new or worsening symptoms.  Patient agrees with plan.      FINAL CLINICAL IMPRESSION(S) / ED DIAGNOSES   Final diagnoses:  Anxiety  Alcohol abuse     Rx / DC Orders   ED Discharge Orders          Ordered    Ambulatory Referral to Primary Care (Establish Care)        09/28/22 2104    ondansetron (ZOFRAN-ODT) 4 MG disintegrating tablet  Every 8 hours PRN        09/28/22 2104              Note:  This document was prepared using Dragon voice recognition software and may include unintentional dictation errors.   Chesley Noon, MD 09/28/22 2124

## 2022-09-28 NOTE — ED Triage Notes (Addendum)
Patient presents with bilateral hand cramping, diaphoretic, reports experiencing anxiety attack.   Patient reports he ran out of his xanax three days ago and cannot get it refilled until tomorrow. He also states he has been binge drinking the past week and has not had a drink since yesterday  A&O x4  Patient would like passed along not to speak about his medical diagnosis in front of any visitors/significant other.

## 2023-04-11 DIAGNOSIS — F10939 Alcohol use, unspecified with withdrawal, unspecified: Secondary | ICD-10-CM | POA: Insufficient documentation

## 2023-04-27 DIAGNOSIS — Z21 Asymptomatic human immunodeficiency virus [HIV] infection status: Secondary | ICD-10-CM | POA: Insufficient documentation

## 2023-05-21 ENCOUNTER — Ambulatory Visit: Payer: MEDICAID | Admitting: Cardiology

## 2023-06-02 ENCOUNTER — Ambulatory Visit: Payer: MEDICAID | Admitting: Cardiology

## 2023-06-02 ENCOUNTER — Encounter: Payer: Self-pay | Admitting: Cardiology

## 2023-06-02 VITALS — BP 110/80 | HR 79 | Ht 63.0 in | Wt 150.0 lb

## 2023-06-02 DIAGNOSIS — Z0130 Encounter for examination of blood pressure without abnormal findings: Secondary | ICD-10-CM

## 2023-06-02 DIAGNOSIS — F418 Other specified anxiety disorders: Secondary | ICD-10-CM

## 2023-06-02 DIAGNOSIS — Z21 Asymptomatic human immunodeficiency virus [HIV] infection status: Secondary | ICD-10-CM

## 2023-06-02 NOTE — Progress Notes (Signed)
 New Patient Office Visit  Subjective   Patient ID: Collin Jones, male    DOB: 1995-11-11  Age: 28 y.o. MRN: [id number]  CC:  Chief Complaint  Patient presents with   New Patient (Initial Visit)    New Patient    HPI Collin Jones presents to establish care Previous Primary Care provider/office:   he does not have additional concerns to discuss today.   Patient in office to establish care. Patient doing well, no complaints today. States he is 17 days sober. Currently being seen at [facility]. Recently moved to the area from [facility]. Patient was following with infectious disease for HIV, needs referral here in [place], referral sent. Patient requesting to start [place], will defer to infectious disease.     Outpatient Encounter Medications as of 06/02/2023  Medication Sig   gabapentin (NEURONTIN) 100 MG capsule Take 100 mg by mouth 2 (two) times daily.   hydrOXYzine (VISTARIL) 100 MG capsule Take 1 capsule (100 mg total) by mouth 3 (three) times daily as needed for anxiety.   ondansetron (ZOFRAN-ODT) 4 MG disintegrating tablet Take 1 tablet (4 mg total) by mouth every 8 (eight) hours as needed for nausea or vomiting.   [DISCONTINUED] BIKTARVY 50-200-25 MG TABS tablet Take 1 tablet by mouth daily. (Patient not taking: Reported on 06/02/2023)   No facility-administered encounter medications on file as of 06/02/2023.    Past Medical History:  Diagnosis Date   Anxiety    Asthma    HIV (human immunodeficiency virus infection) (HCC)    IBS (irritable bowel syndrome)     History reviewed. No pertinent surgical history.  History reviewed. No pertinent family history.  Social History   Socioeconomic History   Marital status: Single    Spouse name: Not on file   Number of children: Not on file   Years of education: Not on file   Highest education level: Not on file  Occupational History   Not on file  Tobacco Use   Smoking status: Former    Types: Cigarettes    Smokeless tobacco: Never  Vaping Use   Vaping status: Every Day  Substance and Sexual Activity   Alcohol use: Yes    Comment: weekly   Drug use: Yes    Types: Marijuana    Comment: xanax   Sexual activity: Yes    Birth control/protection: None  Other Topics Concern   Not on file  Social History Narrative   Not on file   Social Drivers of Health   Financial Resource Strain: Not on file  Food Insecurity: Low Risk  (10/22/2022)   Received from [facility]   Hunger Vital Sign    Worried About Running Out of Food in the Last Year: Never true    Ran Out of Food in the Last Year: Never true  Transportation Needs: No Transportation Needs (10/22/2022)   Received from [facility]    In the past 12 months, has lack of reliable transportation kept you from medical appointments, meetings, work or from getting things needed for daily living? : No  Physical Activity: Not on file  Stress: Stress Concern Present (08/13/2018)   Received from [facility] visits prior to 05/17/2022., [facility] visits prior to 05/17/2022.   [employer] of Occupational Health - Occupational Stress Questionnaire    Feeling of Stress : Very much  Social Connections: Unknown (08/13/2018)   Received from [facility] visits  prior to 05/17/2022., [facility] visits prior to 05/17/2022.   Social Advertising account executive [NHANES]    Frequency of Communication with Friends and Family: Not on file    Frequency of Social Gatherings with Friends and Family: Not on file    Attends Religious Services: Never    Database administrator or Organizations: Not on file    Attends Banker Meetings: Not on file    Marital Status: Never married  Intimate Partner Violence: Not on file    Review of Systems  Constitutional: Negative.   HENT: Negative.    Eyes: Negative.   Respiratory: Negative.  Negative for  shortness of breath.   Cardiovascular: Negative.  Negative for chest pain.  Gastrointestinal: Negative.  Negative for abdominal pain, constipation and diarrhea.  Genitourinary: Negative.   Musculoskeletal:  Negative for joint pain and myalgias.  Skin: Negative.   Neurological: Negative.  Negative for dizziness and headaches.  Endo/Heme/Allergies: Negative.   Psychiatric/Behavioral:  Positive for depression. The patient is nervous/anxious.   All other systems reviewed and are negative.       Objective   BP 110/80   Pulse 79   Ht 5\' 3"  (1.6 m)   Wt 150 lb (68 kg)   SpO2 98%   BMI 26.57 kg/m   Physical Exam Nursing note reviewed.  Constitutional:      Appearance: Normal appearance. He is normal weight.  HENT:     Head: Normocephalic and atraumatic.     Nose: Nose normal.     Mouth/Throat:     Mouth: Mucous membranes are moist.     Pharynx: Oropharynx is clear.  Eyes:     Extraocular Movements: Extraocular movements intact.     Conjunctiva/sclera: Conjunctivae normal.     Pupils: Pupils are equal, round, and reactive to light.  Cardiovascular:     Rate and Rhythm: Normal rate and regular rhythm.     Pulses: Normal pulses.     Heart sounds: Normal heart sounds.  Pulmonary:     Effort: Pulmonary effort is normal.     Breath sounds: Normal breath sounds.  Abdominal:     General: Abdomen is flat. Bowel sounds are normal.     Palpations: Abdomen is soft.  Musculoskeletal:        General: Normal range of motion.     Cervical back: Normal range of motion.  Skin:    General: Skin is warm and dry.  Neurological:     General: No focal deficit present.     Mental Status: He is alert and oriented to person, place, and time.  Psychiatric:        Mood and Affect: Mood normal.        Behavior: Behavior normal.        Thought Content: Thought content normal.        Judgment: Judgment normal.       06/02/2023    1:20 PM  GAD 7 : Generalized Anxiety Score  Nervous,  Anxious, on Edge 1  Control/stop worrying 1  Worry too much - different things 2  Trouble relaxing 2  Restless 2  Easily annoyed or irritable 2  Afraid - awful might happen 2  Total GAD 7 Score 12    Flowsheet Row Office Visit from 06/02/2023 in [facility]  Thoughts that you would be better off dead, or of hurting yourself in some way Not at all  PHQ-9 Total Score 10  Assessment & Plan:  Referral sent to infectious disease.   Problem List Items Addressed This Visit       Other   HIV (human immunodeficiency virus infection) (HCC) - Primary   Relevant Orders   Ambulatory referral to Infectious Disease   Depression with anxiety    Return in about 6 months (around 12/03/2023).   Total time spent: 25 minutes  [employer], NP  06/02/2023   This document may have been prepared by Dragon Voice Recognition software and as such may include unintentional dictation errors.

## 2023-10-22 ENCOUNTER — Ambulatory Visit: Payer: MEDICAID | Admitting: Family Medicine

## 2023-12-03 ENCOUNTER — Ambulatory Visit: Payer: MEDICAID | Admitting: Cardiology

## 2024-01-07 ENCOUNTER — Emergency Department: Payer: MEDICAID

## 2024-01-07 ENCOUNTER — Emergency Department
Admission: EM | Admit: 2024-01-07 | Discharge: 2024-01-08 | Disposition: A | Discharge status: Home / Self Care | Payer: MEDICAID | Attending: Emergency Medicine | Admitting: Emergency Medicine

## 2024-01-07 ENCOUNTER — Other Ambulatory Visit: Payer: Self-pay

## 2024-01-07 DIAGNOSIS — S0081XA Abrasion of other part of head, initial encounter: Secondary | ICD-10-CM | POA: Diagnosis not present

## 2024-01-07 DIAGNOSIS — F419 Anxiety disorder, unspecified: Secondary | ICD-10-CM | POA: Diagnosis not present

## 2024-01-07 DIAGNOSIS — F10120 Alcohol abuse with intoxication, uncomplicated: Secondary | ICD-10-CM | POA: Diagnosis not present

## 2024-01-07 DIAGNOSIS — R7309 Other abnormal glucose: Secondary | ICD-10-CM | POA: Diagnosis not present

## 2024-01-07 DIAGNOSIS — F1310 Sedative, hypnotic or anxiolytic abuse, uncomplicated: Secondary | ICD-10-CM | POA: Diagnosis not present

## 2024-01-07 DIAGNOSIS — S0990XA Unspecified injury of head, initial encounter: Secondary | ICD-10-CM | POA: Diagnosis present

## 2024-01-07 DIAGNOSIS — F10920 Alcohol use, unspecified with intoxication, uncomplicated: Secondary | ICD-10-CM

## 2024-01-07 LAB — MAGNESIUM: Magnesium: 1.5 mg/dL — ABNORMAL LOW (ref 1.7–2.4)

## 2024-01-07 LAB — COMPREHENSIVE METABOLIC PANEL WITH GFR
ALT: 39 U/L (ref 0–44)
AST: 62 U/L — ABNORMAL HIGH (ref 15–41)
Albumin: 4.5 g/dL (ref 3.5–5.0)
Alkaline Phosphatase: 65 U/L (ref 38–126)
Anion gap: 16 — ABNORMAL HIGH (ref 5–15)
BUN: 8 mg/dL (ref 6–20)
CO2: 20 mmol/L — ABNORMAL LOW (ref 22–32)
Calcium: 9 mg/dL (ref 8.9–10.3)
Chloride: 104 mmol/L (ref 98–111)
Creatinine, Ser: 0.87 mg/dL (ref 0.61–1.24)
GFR, Estimated: >60 mL/min (ref >60)
Glucose, Bld: 75 mg/dL (ref 70–99)
Potassium: 3.4 mmol/L — ABNORMAL LOW (ref 3.5–5.1)
Sodium: 140 mmol/L (ref 135–145)
Total Bilirubin: 0.9 mg/dL (ref 0.0–1.2)
Total Protein: 8.4 g/dL — ABNORMAL HIGH (ref 6.5–8.1)

## 2024-01-07 LAB — URINE DRUG SCREEN, QUALITATIVE (ARMC ONLY)
Amphetamines, Ur Screen: NOT DETECTED
Barbiturates, Ur Screen: NOT DETECTED
Benzodiazepine, Ur Scrn: POSITIVE — AB
Cannabinoid 50 Ng, Ur ~~LOC~~: POSITIVE — AB
Cocaine Metabolite,Ur ~~LOC~~: NOT DETECTED
MDMA (Ecstasy)Ur Screen: NOT DETECTED
Methadone Scn, Ur: NOT DETECTED
Opiate, Ur Screen: NOT DETECTED
Phencyclidine (PCP) Ur S: NOT DETECTED
Tricyclic, Ur Screen: POSITIVE — AB

## 2024-01-07 LAB — CBC
HCT: 46.8 % (ref 39.0–52.0)
Hemoglobin: 15.9 g/dL (ref 13.0–17.0)
MCH: 31.1 pg (ref 26.0–34.0)
MCHC: 34 g/dL (ref 30.0–36.0)
MCV: 91.4 fL (ref 80.0–100.0)
Platelets: 169 K/uL (ref 150–400)
RBC: 5.12 MIL/uL (ref 4.22–5.81)
RDW: 13.2 % (ref 11.5–15.5)
WBC: 7.4 K/uL (ref 4.0–10.5)
nRBC: 0 % (ref 0.0–0.2)

## 2024-01-07 LAB — CBG MONITORING, ED
Glucose-Capillary: 62 mg/dL — ABNORMAL LOW (ref 70–99)
Glucose-Capillary: 102 mg/dL — ABNORMAL HIGH (ref 70–99)

## 2024-01-07 LAB — ACETAMINOPHEN LEVEL: Acetaminophen (Tylenol), Serum: <10 ug/mL — ABNORMAL LOW (ref 10–30)

## 2024-01-07 LAB — ETHANOL: Alcohol, Ethyl (B): 153 mg/dL — ABNORMAL HIGH (ref <15)

## 2024-01-07 LAB — SALICYLATE LEVEL: Salicylate Lvl: <7 mg/dL — ABNORMAL LOW (ref 7.0–30.0)

## 2024-01-07 MED ORDER — LORAZEPAM 0.5 MG PO TABS
0.5000 mg | ORAL_TABLET | Freq: Once | ORAL | Status: AC
  Administered 2024-01-07: 0.5 mg via ORAL
  Filled 2024-01-07: qty 1

## 2024-01-07 MED ORDER — LORAZEPAM 2 MG/ML IJ SOLN
1.0000 mg | INTRAMUSCULAR | Status: DC | PRN
  Administered 2024-01-07: 2 mg via INTRAVENOUS
  Filled 2024-01-07: qty 1

## 2024-01-07 MED ORDER — ONDANSETRON 4 MG PO TBDP
4.0000 mg | ORAL_TABLET | Freq: Once | ORAL | Status: AC
  Administered 2024-01-07: 4 mg via ORAL
  Filled 2024-01-07: qty 1

## 2024-01-07 MED ORDER — MAGNESIUM SULFATE IN D5W 1-5 GM/100ML-% IV SOLN
1.0000 g | Freq: Once | INTRAVENOUS | Status: AC
Start: 2024-01-07 — End: 2024-01-07
  Administered 2024-01-07: 1 g via INTRAVENOUS
  Filled 2024-01-07: qty 100

## 2024-01-07 MED ORDER — FOLIC ACID 1 MG PO TABS
1.0000 mg | ORAL_TABLET | Freq: Every day | ORAL | Status: DC
  Administered 2024-01-07: 1 mg via ORAL
  Filled 2024-01-07: qty 1

## 2024-01-07 MED ORDER — THIAMINE HCL 100 MG/ML IJ SOLN: 100.0000 mg | Freq: Every day | INTRAMUSCULAR | Status: DC

## 2024-01-07 MED ORDER — ACETAMINOPHEN 500 MG PO TABS
1000.0000 mg | ORAL_TABLET | Freq: Once | ORAL | Status: AC
  Administered 2024-01-07: 1000 mg via ORAL
  Filled 2024-01-07: qty 2

## 2024-01-07 MED ORDER — THIAMINE MONONITRATE 100 MG PO TABS
100.0000 mg | ORAL_TABLET | Freq: Every day | ORAL | Status: DC
  Administered 2024-01-07: 100 mg via ORAL
  Filled 2024-01-07: qty 1

## 2024-01-07 MED ORDER — ADULT MULTIVITAMIN W/MINERALS CH
1.0000 | ORAL_TABLET | Freq: Every day | ORAL | Status: DC
  Administered 2024-01-07: 1 via ORAL
  Filled 2024-01-07: qty 1

## 2024-01-07 MED ORDER — LORAZEPAM 1 MG PO TABS
1.0000 mg | ORAL_TABLET | ORAL | Status: DC | PRN
  Administered 2024-01-08 (×2): 1 mg via ORAL
  Filled 2024-01-07 (×2): qty 1

## 2024-01-07 MED ORDER — SODIUM CHLORIDE 0.9 % IV BOLUS
1000.0000 mL | Freq: Once | INTRAVENOUS | Status: AC
  Administered 2024-01-07: 1000 mL via INTRAVENOUS

## 2024-01-07 NOTE — ED Notes (Addendum)
 Pt changed into psych safe clothes. Pt has a suit case of clothes and shoes. Pt has a brown hand bag that has multiple drugs to include hydroxyzine , atenolol, xanax . Pt has four bottles of crushed and broken pieces of xanax . RN unable to count accurate amount.   Pt clothes that he has on his person is White pants [employer] Phone [employer]

## 2024-01-07 NOTE — BH Assessment (Signed)
 Referral information for Detox treatment faxed to;   [facility]  [phone]  [facility]  [phone]  [facility]  [phone]  [facility]  [phone]  [facility]  [phone]  [facility]  [phone]

## 2024-01-07 NOTE — ED Triage Notes (Signed)
 Pt sts that he is here for Alcohol withdrawal S/S. Pt denies any HI/SI thoughts. Pt sts that he drinks a 5th of alcohol daily and takes xanax  with it. Pt sts that he normally drinks vokda however pt sts that he has been drinking whiskey also with xanax  also. Pt sts that that his last drink was at 1500 today.
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
## 2024-01-07 NOTE — BH Assessment (Signed)
 Comprehensive Clinical Assessment (CCA) Screening, Triage and Referral Note  01/07/2024 Konstantine Gervasi [id number]  Chief Complaint:  Chief Complaint  Patient presents with   Alcohol Problem   Visit Diagnosis: Alcohol Use Disorder  Aleksander Edmiston is 28 year old who present to the ER seeking assistance with detox from alcohol and Xanax . He states he is using on a daily basis. He self-report 1/5 of alcohol and 1mg  of Xanax . His symptoms of withdrawal are; muscle spasms, headaches and cold chills.  Patient denies SI/HI and AV/H  Patient Reported Information How did you hear about us ? Self  What Is the Reason for Your Visit/Call Today? Patient came to the ER seeking assistance with detox from alcohol and Xanax .  How Long Has This Been Causing You Problems? 1-6 months  What Do You Feel Would Help You the Most Today? Alcohol or Drug Use Treatment   Have You Recently Had Any Thoughts About Hurting Yourself? No  Are You Planning to Commit Suicide/Harm Yourself At This time? No   Have you Recently Had Thoughts About Hurting Someone Sherral? No  Are You Planning to Harm Someone at This Time? No  Explanation: No data recorded  Have You Used Any Alcohol or Drugs in the Past 24 Hours? Yes  How Long Ago Did You Use Drugs or Alcohol? No data recorded What Did You Use and How Much? No data recorded  Do You Currently Have a Therapist/Psychiatrist? No  Name of Therapist/Psychiatrist: No data recorded  Have You Been Recently Discharged From Any Office Practice or Programs? No  Explanation of Discharge From Practice/Program: No data recorded   CCA Screening Triage Referral Assessment Type of Contact: Face-to-Face  Telemedicine Service Delivery:   Is this Initial or Reassessment?   Date Telepsych consult ordered in [facility]:    Time Telepsych consult ordered in [facility]:    Location of Assessment: [facility] ED  Provider Location: [facility] ED    Collateral Involvement: No data recorded  Does Patient  Have a Court Appointed Legal Guardian? No data recorded Name and Contact of Legal Guardian: No data recorded If Minor and Not Living with Parent(s), Who has Custody? No data recorded Is CPS involved or ever been involved? Never  Is APS involved or ever been involved? Never   Patient Determined To Be At Risk for Harm To Self or Others Based on Review of Patient Reported Information or Presenting Complaint? No  Method: No data recorded Availability of Means: No data recorded Intent: No data recorded Notification Required: No data recorded Additional Information for Danger to Others Potential: No data recorded Additional Comments for Danger to Others Potential: No data recorded Are There Guns or Other Weapons in Your Home? No  Types of Guns/Weapons: No data recorded Are These Weapons Safely Secured?                            No data recorded Who Could Verify You Are Able To Have These Secured: No data recorded Do You Have any Outstanding Charges, Pending Court Dates, Parole/Probation? No data recorded Contacted To Inform of Risk of Harm To Self or Others: No data recorded  Does Patient Present under Involuntary Commitment? No    [place] of Residence: [place]   Patient Currently Receiving the Following Services: Not Receiving Services   Determination of Need: Emergent (2 hours)   Options For Referral: ED Visit; Other: Comment   Disposition Recommendation per psychiatric provider: Detox Facility  Kiki DOROTHA Barge MS,  HEN[facility], NCC Therapeutic Triage Specialist 01/07/2024 6:25 PM

## 2024-01-07 NOTE — ED Triage Notes (Signed)
 First nurse note: Pt to ED via [facility] home. Pt reports etoh withdrawal. Mom reports SI. Pt denies SI. Last drink last pm and had 3 shots of vodka.   155/105 HR 120 97% RA  98.8 CBG 84

## 2024-01-07 NOTE — ED Notes (Addendum)
 Pts CBG was 62. Pt given orange juice and a sandwich tray as an attempt to bring it up. Mumma, MD notified.

## 2024-01-07 NOTE — BH Assessment (Signed)
 This Clinical research associate spoke with patient briefly regarding the process of locating detox facilities. Patient reports that prefers [facility] but knows that they will not accept his insurance, patient reports he prefers a facility that will give me medication to help me detox from alcohol and benzos, a white pill with a blue strip that I got before, I need them to help me find somewhere to stay and I need help with getting a job. This Clinical research associate informed the patient that his information has been sent to several facilities, including [facility] in [place] and will look into the availability

## 2024-01-07 NOTE — ED Notes (Signed)
 Pt stated he felt like he might be having another panic attack or his blood pressure was elevated. Took pt vitals at this time.

## 2024-01-07 NOTE — ED Notes (Signed)
 CBG 102

## 2024-01-07 NOTE — ED Notes (Signed)
 This RN and Glass blower/designer counted and verified medication provided by Pt.  Pantoprazole   2 whole pills with crushed pills unable to count Phenobarbital 8 1/2 with crushed pills unable to count Vicodin 6 whole pills with crushed pills mixed in unable to count Atenolol 7 Hydroxyzine  58 Chlordiazepoxide 8

## 2024-01-07 NOTE — ED Notes (Signed)
 VOL/  PENDING  CONSULT

## 2024-01-08 ENCOUNTER — Other Ambulatory Visit (HOSPITAL_COMMUNITY)
Admission: EM | Admit: 2024-01-08 | Discharge: 2024-01-11 | Disposition: A | Discharge status: Home / Self Care | Payer: MEDICAID | Source: Intra-hospital | Attending: Psychiatry | Admitting: Psychiatry

## 2024-01-08 DIAGNOSIS — F1014 Alcohol abuse with alcohol-induced mood disorder: Secondary | ICD-10-CM | POA: Insufficient documentation

## 2024-01-08 DIAGNOSIS — Z5902 Unsheltered homelessness: Secondary | ICD-10-CM | POA: Insufficient documentation

## 2024-01-08 DIAGNOSIS — F411 Generalized anxiety disorder: Secondary | ICD-10-CM | POA: Insufficient documentation

## 2024-01-08 DIAGNOSIS — F1010 Alcohol abuse, uncomplicated: Secondary | ICD-10-CM | POA: Diagnosis present

## 2024-01-08 MED ORDER — HALOPERIDOL 5 MG PO TABS: 5.0000 mg | ORAL_TABLET | Freq: Three times a day (TID) | ORAL | Status: DC | PRN

## 2024-01-08 MED ORDER — QUETIAPINE FUMARATE 50 MG PO TABS
50.0000 mg | ORAL_TABLET | Freq: Every day | ORAL | Status: DC
  Administered 2024-01-08 – 2024-01-10 (×3): 50 mg via ORAL
  Filled 2024-01-08 (×3): qty 1

## 2024-01-08 MED ORDER — PHENOL 1.4 % MT LIQD
1.0000 | Freq: Three times a day (TID) | OROMUCOSAL | Status: DC | PRN
  Administered 2024-01-09: 1 via OROMUCOSAL
  Filled 2024-01-08: qty 177

## 2024-01-08 MED ORDER — HYDROXYZINE HCL 25 MG PO TABS
25.0000 mg | ORAL_TABLET | Freq: Four times a day (QID) | ORAL | Status: DC | PRN
  Administered 2024-01-08 (×3): 25 mg via ORAL
  Filled 2024-01-08 (×3): qty 1

## 2024-01-08 MED ORDER — ADULT MULTIVITAMIN W/MINERALS CH
1.0000 | ORAL_TABLET | Freq: Every day | ORAL | Status: DC
  Administered 2024-01-08 – 2024-01-10 (×3): 1 via ORAL
  Filled 2024-01-08 (×4): qty 1

## 2024-01-08 MED ORDER — ONDANSETRON 4 MG PO TBDP: 4.0000 mg | ORAL_TABLET | Freq: Four times a day (QID) | ORAL | Status: AC | PRN

## 2024-01-08 MED ORDER — HALOPERIDOL LACTATE 5 MG/ML IJ SOLN: 5.0000 mg | Freq: Three times a day (TID) | INTRAMUSCULAR | Status: DC | PRN

## 2024-01-08 MED ORDER — CHLORDIAZEPOXIDE HCL 25 MG PO CAPS
25.0000 mg | ORAL_CAPSULE | Freq: Four times a day (QID) | ORAL | Status: AC | PRN
  Administered 2024-01-08 – 2024-01-10 (×3): 25 mg via ORAL
  Filled 2024-01-08 (×3): qty 1

## 2024-01-08 MED ORDER — LOPERAMIDE HCL 2 MG PO CAPS: 2.0000 mg | ORAL_CAPSULE | ORAL | Status: AC | PRN

## 2024-01-08 MED ORDER — ALUM & MAG HYDROXIDE-SIMETH 200-200-20 MG/5ML PO SUSP: 30.0000 mL | ORAL | Status: DC | PRN

## 2024-01-08 MED ORDER — METOPROLOL SUCCINATE ER 50 MG PO TB24
50.0000 mg | ORAL_TABLET | Freq: Every day | ORAL | Status: DC
  Administered 2024-01-08: 50 mg via ORAL
  Filled 2024-01-08: qty 1

## 2024-01-08 MED ORDER — DIPHENHYDRAMINE HCL 50 MG PO CAPS: 50.0000 mg | ORAL_CAPSULE | Freq: Three times a day (TID) | ORAL | Status: DC | PRN

## 2024-01-08 MED ORDER — HYDROXYZINE HCL 25 MG PO TABS
50.0000 mg | ORAL_TABLET | Freq: Four times a day (QID) | ORAL | Status: DC | PRN
  Administered 2024-01-08 – 2024-01-09 (×3): 50 mg via ORAL
  Filled 2024-01-08 (×3): qty 2

## 2024-01-08 MED ORDER — THIAMINE HCL 100 MG/ML IJ SOLN
100.0000 mg | Freq: Once | INTRAMUSCULAR | Status: AC
  Administered 2024-01-08: 100 mg via INTRAMUSCULAR
  Filled 2024-01-08: qty 2

## 2024-01-08 MED ORDER — DIPHENHYDRAMINE HCL 50 MG/ML IJ SOLN: 50.0000 mg | Freq: Three times a day (TID) | INTRAMUSCULAR | Status: DC | PRN

## 2024-01-08 MED ORDER — LORAZEPAM 2 MG/ML IJ SOLN: 2.0000 mg | Freq: Three times a day (TID) | INTRAMUSCULAR | Status: DC | PRN

## 2024-01-08 MED ORDER — MAGNESIUM HYDROXIDE 400 MG/5ML PO SUSP: 30.0000 mL | Freq: Every day | ORAL | Status: DC | PRN

## 2024-01-08 MED ORDER — HALOPERIDOL LACTATE 5 MG/ML IJ SOLN: 10.0000 mg | Freq: Three times a day (TID) | INTRAMUSCULAR | Status: DC | PRN

## 2024-01-08 MED ORDER — DOLUTEGRAVIR-RILPIVIRINE 50-25 MG PO TABS
1.0000 | ORAL_TABLET | Freq: Every day | ORAL | Status: DC
  Administered 2024-01-08 – 2024-01-11 (×4): 1 via ORAL
  Filled 2024-01-08 (×5): qty 1

## 2024-01-08 MED ORDER — ACETAMINOPHEN 325 MG PO TABS: 650.0000 mg | ORAL_TABLET | Freq: Four times a day (QID) | ORAL | Status: DC | PRN

## 2024-01-08 NOTE — ED Notes (Signed)
 Pt's belongings (two bags and one black suit case) were returned to pt. Pt's medications were also returned to him.

## 2024-01-08 NOTE — ED Provider Notes (Signed)
 Behavioral Health Progress Note  Date and Time: 01/08/2024 12:03 PM Name: Collin Jones MRN:  [id number]  Subjective: Patient states I want to go to substance use treatment but I need to get a job.  I have been living in my car and I will lose everything if I go away for a month or more.  Patient is assessed by this nurse practitioner face-to-face.  He is seated, no apparent distress.  He is alert and oriented, pleasant and cooperative during assessment.  Patient presents with depressed mood, congruent affect.  [name] reports depressed mood worsening x 1 month.  Reports he was fired from his employment 1 month ago and has subsequently been forced to reside in his car.  Patient denies suicidal and homicidal ideations currently.  Endorsed suicidal ideations prior to arrival on yesterday per his report and chart review.  Patient denies history of suicide attempts, denies history of nonsuicidal self-harm behavior.  He contracts verbally for safety at this time.  Patient denies auditory and visual hallucinations.  There is no evidence of delusional thought content and no indication that patient is responding to internal stimuli.  Additional stressors include substance use.  Patient endorses drinking up to 1/5 of alcohol daily also uses illicit benzodiazepine medications daily.  Collin Jones reports plan to consider residential substance use treatment options.  Patient endorses history of major depressive disorder.  He has been followed by [facility], [employer] .  Reports discharge from [facility] outpatient program in March 2025.  Plans to resume outpatient follow-up at [facility].  Patient reports 24-hour admission to [facility] facility based crisis unit 2 days ago.  Patient tells me he would like to be admitted to residential treatment services in [place]  however he has followed up with [facility] and they do not accept his insurance.  Patient states I will stay here for the detox and then  decide, I would like to go and get a job so that I do not lose my phone in my car.  Patient offered support and encouragement.    Diagnosis:  Final diagnoses:  Alcohol abuse    Total Time spent with patient: 30 minutes  Past Psychiatric History: Alcohol use disorder, depression, anxiety, ADHD, eating disorder Past Medical History: HIV Family History: None reported Family Psychiatric  History: None reported Social History: Reports currently resides in his car at his mother's home, endorses alcohol use, endorses illicit benzodiazepine use, unemployed, 2 interviews for new employment opportunities scheduled for next week  Additional Social History:                         Sleep: Fair  Appetite:  Fair  Current Medications:  Current Facility-Administered Medications  Medication Dose Route Frequency Provider Last Rate Last Admin   acetaminophen  (TYLENOL ) tablet 650 mg  650 mg Oral Q6H PRN Trudy Carwin, NP       alum & mag hydroxide-simeth (MAALOX/MYLANTA) 200-200-20 MG/5ML suspension 30 mL  30 mL Oral Q4H PRN Trudy Carwin, NP       chlordiazePOXIDE (LIBRIUM) capsule 25 mg  25 mg Oral Q6H PRN Trudy Carwin, NP   25 mg at 01/08/24 0758   haloperidol (HALDOL) tablet 5 mg  5 mg Oral TID PRN Trudy Carwin, NP       And   diphenhydrAMINE (BENADRYL) capsule 50 mg  50 mg Oral TID PRN Trudy Carwin, NP       haloperidol lactate (HALDOL) injection 5 mg  5 mg Intramuscular  TID PRN Trudy Carwin, NP       And   diphenhydrAMINE (BENADRYL) injection 50 mg  50 mg Intramuscular TID PRN Trudy Carwin, NP       And   LORazepam  (ATIVAN ) injection 2 mg  2 mg Intramuscular TID PRN Trudy Carwin, NP       haloperidol lactate (HALDOL) injection 10 mg  10 mg Intramuscular TID PRN Trudy Carwin, NP       And   diphenhydrAMINE (BENADRYL) injection 50 mg  50 mg Intramuscular TID PRN Trudy Carwin, NP       And   LORazepam  (ATIVAN ) injection 2 mg  2 mg Intramuscular TID PRN Trudy Carwin, NP        dolutegravir-rilpivirine (JULUCA) 50-25 MG per tablet 1 tablet  1 tablet Oral Daily Collin Ellouise CROME, FNP       hydrOXYzine  (ATARAX ) tablet 25 mg  25 mg Oral Q6H PRN Trudy Carwin, NP   25 mg at 01/08/24 0758   loperamide (IMODIUM) capsule 2-4 mg  2-4 mg Oral PRN Trudy Carwin, NP       magnesium hydroxide (MILK OF MAGNESIA) suspension 30 mL  30 mL Oral Daily PRN Trudy Carwin, NP       multivitamin with minerals tablet 1 tablet  1 tablet Oral Daily Trudy Carwin, NP   1 tablet at 01/08/24 9240   ondansetron  (ZOFRAN -ODT) disintegrating tablet 4 mg  4 mg Oral Q6H PRN Trudy Carwin, NP       Current Outpatient Medications  Medication Sig Dispense Refill   gabapentin (NEURONTIN) 100 MG capsule Take 100 mg by mouth 2 (two) times daily.     hydrOXYzine  (ATARAX ) 25 MG tablet Take 25 mg by mouth every 6 (six) hours as needed for anxiety.     JULUCA 50-25 MG tablet Take 1 tablet by mouth daily.     QUEtiapine (SEROQUEL) 25 MG tablet Take 25 mg by mouth at bedtime.      Labs  Lab Results:  Admission on 01/07/2024, Discharged on 01/08/2024  Component Date Value Ref Range Status   Sodium 01/07/2024 140  135 - 145 mmol/L Final   Potassium 01/07/2024 3.4 (L)  3.5 - 5.1 mmol/L Final   Chloride 01/07/2024 104  98 - 111 mmol/L Final   CO2 01/07/2024 20 (L)  22 - 32 mmol/L Final   Glucose, Bld 01/07/2024 75  70 - 99 mg/dL Final   Glucose reference range applies only to samples taken after fasting for at least 8 hours.   BUN 01/07/2024 8  6 - 20 mg/dL Final   Creatinine, Ser 01/07/2024 0.87  0.61 - 1.24 mg/dL Final   Calcium 89/76/7974 9.0  8.9 - 10.3 mg/dL Final   Total Protein 89/76/7974 8.4 (H)  6.5 - 8.1 g/dL Final   Albumin 89/76/7974 4.5  3.5 - 5.0 g/dL Final   AST 89/76/7974 62 (H)  15 - 41 U/L Final   ALT 01/07/2024 39  0 - 44 U/L Final   Alkaline Phosphatase 01/07/2024 65  38 - 126 U/L Final   Total Bilirubin 01/07/2024 0.9  0.0 - 1.2 mg/dL Final   GFR, Estimated 01/07/2024 >60  >60 mL/min  Final   Comment: (NOTE) Calculated using the CKD-EPI Creatinine Equation (2021)    Anion gap 01/07/2024 16 (H)  5 - 15 Final   Performed at [facility], [address] [place], [place] [address]   Alcohol, Ethyl (B) 01/07/2024 153 (H)  <15 mg/dL Corrected   Comment:  RESULT CALLED TO, READ BACK BY AND VERIFIED WITH: TOM NAGY AT 1744 ON 01/07/24 BY SS (NOTE) For medical purposes only. Performed at [facility], [address] [place], [place] [address] CORRECTED ON 10/23 AT 1750: PREVIOUSLY REPORTED AS <15    WBC 01/07/2024 7.4  4.0 - 10.5 K/uL Final   RBC 01/07/2024 5.12  4.22 - 5.81 MIL/uL Final   Hemoglobin 01/07/2024 15.9  13.0 - 17.0 g/dL Final   HCT 89/76/7974 46.8  39.0 - 52.0 % Final   MCV 01/07/2024 91.4  80.0 - 100.0 fL Final   MCH 01/07/2024 31.1  26.0 - 34.0 pg Final   MCHC 01/07/2024 34.0  30.0 - 36.0 g/dL Final   RDW 89/76/7974 13.2  11.5 - 15.5 % Final   Platelets 01/07/2024 169  150 - 400 K/uL Final   nRBC 01/07/2024 0.0  0.0 - 0.2 % Final   Performed at [facility], [address] [place], [place] [address]   Tricyclic, Ur Screen 01/07/2024 POSITIVE (A)  NONE DETECTED Final   Amphetamines, Ur Screen 01/07/2024 NONE DETECTED  NONE DETECTED Final   MDMA (Ecstasy)Ur Screen 01/07/2024 NONE DETECTED  NONE DETECTED Final   Cocaine Metabolite,Ur [place] 01/07/2024 NONE DETECTED  NONE DETECTED Final   Opiate, Ur Screen 01/07/2024 NONE DETECTED  NONE DETECTED Final   Phencyclidine (PCP) Ur S 01/07/2024 NONE DETECTED  NONE DETECTED Final   Cannabinoid 50 Ng, Ur [place] 01/07/2024 POSITIVE (A)  NONE DETECTED Final   Barbiturates, Ur Screen 01/07/2024 NONE DETECTED  NONE DETECTED Final   Benzodiazepine, Ur Scrn 01/07/2024 POSITIVE (A)  NONE DETECTED Final   Methadone Scn, Ur 01/07/2024 NONE DETECTED  NONE DETECTED Final   Comment: (NOTE) Tricyclics + metabolites, urine    Cutoff 1000 ng/mL Amphetamines + metabolites, urine  Cutoff 1000 ng/mL MDMA  (Ecstasy), urine              Cutoff 500 ng/mL Cocaine Metabolite, urine          Cutoff 300 ng/mL Opiate + metabolites, urine        Cutoff 300 ng/mL Phencyclidine (PCP), urine         Cutoff 25 ng/mL Cannabinoid, urine                 Cutoff 50 ng/mL Barbiturates + metabolites, urine  Cutoff 200 ng/mL Benzodiazepine, urine              Cutoff 200 ng/mL Methadone, urine                   Cutoff 300 ng/mL  The urine drug screen provides only a preliminary, unconfirmed analytical test result and should not be used for non-medical purposes. Clinical consideration and professional judgment should be applied to any positive drug screen result due to possible interfering substances. A more specific alternate chemical method must be used in order to obtain a confirmed analytical result. Gas chromatography / mass spectrometry (GC/MS) is the preferred confirm                          atory method. Performed at [facility], [address] [place], [place] [address]    Magnesium 01/07/2024 1.5 (L)  1.7 - 2.4 mg/dL Final   Performed at [facility], [address] [place], [place] [address]   Acetaminophen  (Tylenol ), Serum 01/07/2024 <10 (L)  10 - 30 ug/mL Final   Comment: (NOTE) Therapeutic concentrations vary  significantly. A range of 10-30 ug/mL  may be an effective concentration for many patients. However, some  are best treated at concentrations outside of this range. Acetaminophen  concentrations >150 ug/mL at 4 hours after ingestion  and >50 ug/mL at 12 hours after ingestion are often associated with  toxic reactions.  Performed at [facility], [address] [place], [place] [address]    Salicylate Lvl 01/07/2024 <7.0 (L)  7.0 - 30.0 mg/dL Final   Performed at [facility], [address] [place], [place] [address]   Glucose-Capillary 01/07/2024 62 (L)  70 - 99 mg/dL Final   Glucose reference range applies only to samples taken after  fasting for at least 8 hours.   Glucose-Capillary 01/07/2024 102 (H)  70 - 99 mg/dL Final   Glucose reference range applies only to samples taken after fasting for at least 8 hours.    Blood Alcohol level:  Lab Results  Component Value Date   ETH 153 (H) 01/07/2024   ETH <10 09/28/2022    Metabolic Disorder Labs: No results found for: HGBA1C, MPG No results found for: PROLACTIN No results found for: CHOL, TRIG, HDL, CHOLHDL, VLDL, LDLCALC  Therapeutic Lab Levels: No results found for: LITHIUM No results found for: VALPROATE No results found for: CBMZ  Physical Findings   GAD-7    Flowsheet Row Office Visit from 06/02/2023 in [facility]  Total GAD-7 Score 12   PHQ2-9    Flowsheet Row ED from 01/08/2024 in [facility] Office Visit from 06/02/2023 in [facility]  PHQ-2 Total Score 2 2  PHQ-9 Total Score 13 10   Flowsheet Row ED from 01/08/2024 in [facility] ED from 01/07/2024 in [facility] Emergency Department at [facility] ED from 09/28/2022 in [facility] Emergency Department at [facility]  C-SSRS RISK CATEGORY No Risk No Risk No Risk     Musculoskeletal  Strength & Muscle Tone: within normal limits Gait & Station: normal Patient leans: N/A  Psychiatric Specialty Exam  Presentation  General Appearance:  Appropriate for Environment; Casual  Eye Contact: Good  Speech: Clear and Coherent; Normal Rate  Speech Volume: Normal  Handedness: Right   Mood and Affect  Mood: Depressed  Affect: Depressed   Thought Process  Thought Processes: Coherent; Goal Directed; Linear  Descriptions of Associations:Intact  Orientation:Full (Time, Place and Person)  Thought Content:WDL; Logical     Hallucinations:Hallucinations: None  Ideas of Reference:None  Suicidal Thoughts:Suicidal Thoughts: No  Homicidal Thoughts:Homicidal  Thoughts: No   Sensorium  Memory: Immediate Good; Recent Fair  Judgment: Intact  Insight: Shallow   Executive Functions  Concentration: Good  Attention Span: Good  Recall: Good  Fund of Knowledge: Good  Language: Good   Psychomotor Activity  Psychomotor Activity: Psychomotor Activity: Normal   Assets  Assets: Communication Skills; Desire for Improvement; Financial Resources/Insurance; Physical Health; Resilience; Social Support   Sleep  Sleep: Sleep: Fair  Estimated Sleeping Duration (Last 24 Hours): 0.00 hours  Nutritional Assessment (For OBS and [facility] admissions only) Has the patient had a weight loss or gain of 10 pounds or more in the last 3 months?: No Has the patient had a decrease in food intake/or appetite?: No Does the patient have dental problems?: No Does the patient have eating habits or behaviors that may be indicators of an eating disorder including binging or inducing vomiting?: No Has the patient recently lost weight without trying?: 0 Has the patient been eating  poorly because of a decreased appetite?: 0 Malnutrition Screening Tool Score: 0    Physical Exam  Physical Exam Vitals and nursing note reviewed.  Constitutional:      Appearance: He is well-developed.  HENT:     Head: Normocephalic.     Nose: Nose normal.  Cardiovascular:     Rate and Rhythm: Normal rate.  Pulmonary:     Effort: Pulmonary effort is normal.  Musculoskeletal:        General: Normal range of motion.  Skin:    General: Skin is warm and dry.  Neurological:     Mental Status: He is alert and oriented to person, place, and time.  Psychiatric:        Attention and Perception: Attention and perception normal.        Mood and Affect: Affect normal. Mood is depressed.        Speech: Speech normal.        Behavior: Behavior normal. Behavior is cooperative.        Thought Content: Thought content normal.        Cognition and Memory: Cognition and memory  normal.        Judgment: Judgment normal.    Review of Systems  Constitutional: Negative.   HENT: Negative.    Eyes: Negative.   Respiratory: Negative.    Cardiovascular: Negative.   Gastrointestinal: Negative.   Genitourinary: Negative.   Musculoskeletal: Negative.   Skin: Negative.   Neurological: Negative.   Psychiatric/Behavioral:  Positive for depression and substance abuse.    Blood pressure (!) 132/96, pulse 80, temperature 97.6 F (36.4 C), resp. rate 18, SpO2 100%. There is no height or weight on file to calculate BMI.  Treatment Plan Summary: Daily contact with patient to assess and evaluate symptoms and progress in treatment Patient remains voluntary.  Considering residential substance use treatment options at this time.  Ellouise LITTIE Dawn, FNP 01/08/2024 12:03 PM

## 2024-01-08 NOTE — Group Note (Signed)
 Group Topic: Balance in Life  Group Date: 01/08/2024 Start Time: 1330 End Time: 1415 Facilitators: Alyse Leilani LABOR, NT; Laneta Renea POUR, NT  Department: [facility]  Number of Participants: 4  Group Focus: activities of daily living skills, clarity of thought, communication, coping skills, and daily focus Treatment Modality:  Skills Training Interventions utilized were clarification, group exercise, and problem solving Purpose: express feelings, regain self-worth, reinforce self-care, and relapse prevention strategies  Name: Collin Jones Date of Birth: January 03, 1996  MR: [id number]    Level of Participation: active Quality of Participation: cooperative and engaged Interactions with others: gave feedback Mood/Affect: positive Triggers (if applicable): none Cognition: goal directed Progress: Gaining insight Response: looking for help  Plan: patient will be encouraged to keep going to groups  Patients Problems:  Patient Active Problem List   Diagnosis Date Noted   Alcohol abuse 01/08/2024   HIV (human immunodeficiency virus infection) (HCC) 04/27/2023   Alcohol withdrawal (HCC) 04/11/2023   Depression with anxiety 08/13/2018   History of ADHD 08/13/2018   History of eating disorder 08/13/2018

## 2024-01-08 NOTE — ED Notes (Signed)
 Provider notified about c/o anxiety. Pt administered increased prn dosage of atarax . New medication order reviewed with pt r/t Seroquel and metoprolol. Pt expressed understanding, questions denied.

## 2024-01-08 NOTE — Discharge Instructions (Addendum)
 Patient  has been accepted to [facility].   Patient at this current moment has accepted the placement.   Patient is scheduled for pick up Monday 01/11/24 by 11:00 am.   Patient will need 7 day supply and scripts

## 2024-01-08 NOTE — ED Notes (Signed)
 Pt cooperative and polite, however c/o anxiety and withdrawal symptoms. Provider made aware.

## 2024-01-08 NOTE — Care Management (Addendum)
 [facility] Care Management...  Writer met with the patient and discussed discharge planning.  Patient requests inpatient substance abuse treatment.     Patient is a 28 y/o male. Patient reported being homeless, single. Patient identified MH and S/A concerns. Patient reported last use of Xanax  and alcohol yesterday.   Patient has been referred to [facility] and [facility]   12:29 pm  Patient referral received at [facility].   Writer provided patient with phone number [phone] to complete phone screening  1:16 pm [employer] does not accept patients insurance  Referral faxed to [facility]  Patient under review at [facility], needs to complete phone screening     3:25 pm  Writer provided patient with phone to complete phone intake with [facility].  Patient completed phone screening with [facility].  [facility] waiting on patient to decide what he wants to do before they assign admittance date.  Writer met with patient... Patient concerned about losing all his belongings and not being able to have his phone if he goes into treatment at [facility].  Patient stated needing his anxiety medication. Writer advised patient that RN reached out to provider and that it will take time for provider to respond.   Writer suggested patient use his coping skills until RN gets feedback from provider    5:50 pm  Patient  has been accepted to [facility] [place].   Patient at this current moment has accepted the placement.   Patient is scheduled for pick up Monday 01/11/24 by 11:00 am.   Patient will need 7 day supply and scripts

## 2024-01-08 NOTE — Group Note (Signed)
 Group Topic: Emotional Regulation  Group Date: 01/08/2024 Start Time: 2000 End Time: 2030 Facilitators: Verdon Jacqualyn BRAVO, NT  Department: [facility]  Number of Participants: 9  Group Focus: daily focus Treatment Modality:  Individual Therapy Interventions utilized were exploration Purpose: express feelings  Name: Collin Jones Date of Birth: [birth year]  MR: [id number]    Level of Participation: active Quality of Participation: cooperative Interactions with others: gave feedback Mood/Affect: appropriate Triggers (if applicable): n/a Cognition: coherent/clear Progress: Moderate Response: n/a Plan: follow-up needed  Patients Problems:  Patient Active Problem List   Diagnosis Date Noted   Alcohol abuse 01/08/2024   HIV (human immunodeficiency virus infection) (HCC) 04/27/2023   Alcohol withdrawal (HCC) 04/11/2023   Depression with anxiety 08/13/2018   History of ADHD 08/13/2018   History of eating disorder 08/13/2018

## 2024-01-08 NOTE — ED Notes (Addendum)
 Patient arrived to the unit and oriented to his room.  Patient is alert and oriented x 4 with no acute distress noted.  Patient currently denies SI/HI/AVH.  Patient appears anxious, but is overall calm and cooperative.  Patient was compliant with scheduled Thiamine IM given to the right deltoid and bandaid given. PRN Hydroxyzine  given as needed for anxiety per patient's request.  Patient tolerated well with emotional support.  Patient has been given a drink, but refused food at this time.  Patient stated he has his HIV medications stored with his belongings, and it will be informed to the RN on dayshift.  No current issues or concerns voiced at this time.  Will continue on Q safety checks.

## 2024-01-08 NOTE — ED Notes (Signed)
 Did not attend [facility] group.

## 2024-01-08 NOTE — Progress Notes (Signed)
 Patient is currently resting in his room.  No acute distress noted.  Respirations present and unlabored.  No issues or concerns noted at this time.

## 2024-01-08 NOTE — [facility] Group Notes (Signed)
 Spiritual Care and Counseling Group Note  01/08/2024 2:45pm  Facilitated by: Librada Donnice Lin   Type of Therapy and Topic:  Hope    Participation Level:  Minimal  Description of Group:  Group focused on topic of hope.  Patients participated in facilitated discussion around topic, connecting with one another around experiences and definitions for hope.  Group members engaged with group word cloud.  Members selected an image of what hope looks like for them today.  Group engaged in discussion around how their definitions of hope are present today in hospital.     Summary of Patient Progress:  Present through initial 15 minutes of group.  Called out of group to speak with staff.  Did not return.    Therapeutic Modalities: Psycho-social ed, Adlerian, Narrative, MI   Librada Donnice Lin, Chaplain 01/08/2024 4:23 PM

## 2024-01-08 NOTE — ED Notes (Addendum)
 Patient is in the bedroom calm and sleeping. NAD. CIWA assessment not completed at this time. Will monitor for safety.

## 2024-01-08 NOTE — ED Notes (Signed)
 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 Throat closes, rash saids he has an epi pen for it.

## 2024-01-08 NOTE — ED Provider Notes (Signed)
 Facility Based Crisis Admission H&P  Date: 01/08/24 Patient Name: Collin Jones MRN: [id number] Chief Complaint: alcohol abuse  Diagnoses:  Final diagnoses:  Alcohol abuse    HPI: Collin Jones, 28 y/o male with a history of alcohol abuse, GAD and depression,  presented to [facility] as a transfer for alcohol detox.  Per the patient he consume alcohol on a daily basis and could not give a specific amount.  Patient reports he has been using Xanax  and stated that he was doing a drug rehab as an outpatient.  Face-to-face observation of patient, patient is alert and oriented x 4, speech is clear, maintain eye contact.  Patient denies SI, HI, AVH or paranoia.  Reports consuming alcohol on a daily basis.  UDS drug screen positive for benzos, marijuana.  At this present moment patient does not seem to be influenced by internal stimuli, does not seem to be in any immediate distress.  Patient was given the opportunity ask question questions were answered.  Recommend [facility] admission  PHQ 2-9:  Flowsheet Row ED from 01/08/2024 in [facility] Office Visit from 06/02/2023 in [facility]  Thoughts that you would be better off dead, or of hurting yourself in some way Not at all Not at all  PHQ-9 Total Score 13 10    Flowsheet Row ED from 01/07/2024 in [facility] Emergency Department at [facility] ED from 09/28/2022 in [facility] Emergency Department at [facility] ED from 04/15/2022 in [facility] Emergency Department at [facility]  C-SSRS RISK CATEGORY No Risk No Risk No Risk      Total Time spent with patient: 30 minutes  Musculoskeletal  Strength & Muscle Tone: within normal limits Gait & Station: normal Patient leans: N/A  Psychiatric Specialty Exam  Presentation General Appearance:  Casual  Eye Contact: Good  Speech: Clear and Coherent  Speech Volume: Normal  Handedness: Right   Mood and Affect   Mood: Euthymic  Affect: Congruent   Thought Process  Thought Processes: Coherent  Descriptions of Associations:Intact  Orientation:Full (Time, Place and Person)  Thought Content:Logical    Hallucinations:Hallucinations: None  Ideas of Reference:None  Suicidal Thoughts:Suicidal Thoughts: No  Homicidal Thoughts:Homicidal Thoughts: No   Sensorium  Memory: Immediate Fair  Judgment: Poor  Insight: Poor   Executive Functions  Concentration: Fair  Attention Span: Fair  Recall: Fair  Fund of Knowledge: Fair  Language: Fair   Psychomotor Activity  Psychomotor Activity: Psychomotor Activity: Normal   Assets  Assets: Desire for Improvement   Sleep  Sleep: Sleep: Fair Number of Hours of Sleep: 7   Nutritional Assessment (For OBS and [facility]BC admissions only) Has the patient had a weight loss or gain of 10 pounds or more in the last 3 months?: No Has the patient had a decrease in food intake/or appetite?: No Does the patient have dental problems?: No Does the patient have eating habits or behaviors that may be indicators of an eating disorder including binging or inducing vomiting?: No Has the patient recently lost weight without trying?: 0 Has the patient been eating poorly because of a decreased appetite?: 0 Malnutrition Screening Tool Score: 0    Physical Exam HENT:     Head: Normocephalic.     Nose: Nose normal.  Eyes:     Pupils: Pupils are equal, round, and reactive to light.  Cardiovascular:     Rate and Rhythm: Tachycardia present.  Pulmonary:     Effort: Pulmonary effort is normal.  Musculoskeletal:  General: Normal range of motion.     Cervical back: Normal range of motion.  Neurological:     General: No focal deficit present.     Mental Status: He is alert.  Psychiatric:        Mood and Affect: Mood normal.        Behavior: Behavior normal.        Thought Content: Thought content normal.        Judgment: Judgment  normal.    Review of Systems  Constitutional: Negative.   HENT: Negative.    Eyes: Negative.   Respiratory: Negative.    Cardiovascular: Negative.   Gastrointestinal: Negative.   Genitourinary: Negative.   Musculoskeletal: Negative.   Skin: Negative.   Neurological: Negative.   Psychiatric/Behavioral:  Positive for substance abuse.     Blood pressure (!) 141/92, pulse (!) 104, temperature 98.7 F (37.1 C), temperature source Oral, resp. rate 18, SpO2 96%. There is no height or weight on file to calculate BMI.  Past Psychiatric History: GAD, MDD, alcohol abuse, opioid abuse  Is the patient at risk to self? No  Has the patient been a risk to self in the past 6 months? No .    Has the patient been a risk to self within the distant past? No   Is the patient a risk to others? No   Has the patient been a risk to others in the past 6 months? No   Has the patient been a risk to others within the distant past? No   Past Medical History: See chart Family History: Unknown Social History: Alcohol, benzo abuse opioid abuse  Last Labs:  Admission on 01/07/2024, Discharged on 01/08/2024  Component Date Value Ref Range Status   Sodium 01/07/2024 140  135 - 145 mmol/L Final   Potassium 01/07/2024 3.4 (L)  3.5 - 5.1 mmol/L Final   Chloride 01/07/2024 104  98 - 111 mmol/L Final   CO2 01/07/2024 20 (L)  22 - 32 mmol/L Final   Glucose, Bld 01/07/2024 75  70 - 99 mg/dL Final   Glucose reference range applies only to samples taken after fasting for at least 8 hours.   BUN 01/07/2024 8  6 - 20 mg/dL Final   Creatinine, Ser 01/07/2024 0.87  0.61 - 1.24 mg/dL Final   Calcium 89/76/7974 9.0  8.9 - 10.3 mg/dL Final   Total Protein 89/76/7974 8.4 (H)  6.5 - 8.1 g/dL Final   Albumin 89/76/7974 4.5  3.5 - 5.0 g/dL Final   AST 89/76/7974 62 (H)  15 - 41 U/L Final   ALT 01/07/2024 39  0 - 44 U/L Final   Alkaline Phosphatase 01/07/2024 65  38 - 126 U/L Final   Total Bilirubin 01/07/2024 0.9  0.0 -  1.2 mg/dL Final   GFR, Estimated 01/07/2024 >60  >60 mL/min Final   Comment: (NOTE) Calculated using the CKD-EPI Creatinine Equation (2021)    Anion gap 01/07/2024 16 (H)  5 - 15 Final   Performed at [facility], [address] [place], [place] [address]   Alcohol, Ethyl (B) 01/07/2024 153 (H)  <15 mg/dL Corrected   Comment: RESULT CALLED TO, READ BACK BY AND VERIFIED WITH: TOM NAGY AT 1744 ON 01/07/24 BY SS (NOTE) For medical purposes only. Performed at [facility], [address] [place], [place] [address] CORRECTED ON 10/23 AT 1750: PREVIOUSLY REPORTED AS <15    WBC 01/07/2024 7.4  4.0 - 10.5 K/uL Final   RBC  01/07/2024 5.12  4.22 - 5.81 MIL/uL Final   Hemoglobin 01/07/2024 15.9  13.0 - 17.0 g/dL Final   HCT 89/76/7974 46.8  39.0 - 52.0 % Final   MCV 01/07/2024 91.4  80.0 - 100.0 fL Final   MCH 01/07/2024 31.1  26.0 - 34.0 pg Final   MCHC 01/07/2024 34.0  30.0 - 36.0 g/dL Final   RDW 89/76/7974 13.2  11.5 - 15.5 % Final   Platelets 01/07/2024 169  150 - 400 K/uL Final   nRBC 01/07/2024 0.0  0.0 - 0.2 % Final   Performed at [facility], [address] [place], [place] [address]   Tricyclic, Ur Screen 01/07/2024 POSITIVE (A)  NONE DETECTED Final   Amphetamines, Ur Screen 01/07/2024 NONE DETECTED  NONE DETECTED Final   MDMA (Ecstasy)Ur Screen 01/07/2024 NONE DETECTED  NONE DETECTED Final   Cocaine Metabolite,Ur [place] 01/07/2024 NONE DETECTED  NONE DETECTED Final   Opiate, Ur Screen 01/07/2024 NONE DETECTED  NONE DETECTED Final   Phencyclidine (PCP) Ur S 01/07/2024 NONE DETECTED  NONE DETECTED Final   Cannabinoid 50 Ng, Ur [place] 01/07/2024 POSITIVE (A)  NONE DETECTED Final   Barbiturates, Ur Screen 01/07/2024 NONE DETECTED  NONE DETECTED Final   Benzodiazepine, Ur Scrn 01/07/2024 POSITIVE (A)  NONE DETECTED Final   Methadone Scn, Ur 01/07/2024 NONE DETECTED  NONE DETECTED Final   Comment: (NOTE) Tricyclics + metabolites, urine    Cutoff 1000  ng/mL Amphetamines + metabolites, urine  Cutoff 1000 ng/mL MDMA (Ecstasy), urine              Cutoff 500 ng/mL Cocaine Metabolite, urine          Cutoff 300 ng/mL Opiate + metabolites, urine        Cutoff 300 ng/mL Phencyclidine (PCP), urine         Cutoff 25 ng/mL Cannabinoid, urine                 Cutoff 50 ng/mL Barbiturates + metabolites, urine  Cutoff 200 ng/mL Benzodiazepine, urine              Cutoff 200 ng/mL Methadone, urine                   Cutoff 300 ng/mL  The urine drug screen provides only a preliminary, unconfirmed analytical test result and should not be used for non-medical purposes. Clinical consideration and professional judgment should be applied to any positive drug screen result due to possible interfering substances. A more specific alternate chemical method must be used in order to obtain a confirmed analytical result. Gas chromatography / mass spectrometry (GC/MS) is the preferred confirm                          atory method. Performed at [facility], [address] [place], [place] [address]    Magnesium 01/07/2024 1.5 (L)  1.7 - 2.4 mg/dL Final   Performed at [facility], [address] [place], [place] [address]   Acetaminophen  (Tylenol ), Serum 01/07/2024 <10 (L)  10 - 30 ug/mL Final   Comment: (NOTE) Therapeutic concentrations vary significantly. A range of 10-30 ug/mL  may be an effective concentration for many patients. However, some  are best treated at concentrations outside of this range. Acetaminophen  concentrations >150 ug/mL at 4 hours after ingestion  and >50 ug/mL at 12 hours after ingestion are often associated with  toxic reactions.  Performed at [facility],  [address], [place], [place] [address]    Salicylate Lvl 01/07/2024 <7.0 (L)  7.0 - 30.0 mg/dL Final   Performed at [facility], [address] [place], [place] [address]   Glucose-Capillary 01/07/2024 62 (L)  70 - 99 mg/dL Final    Glucose reference range applies only to samples taken after fasting for at least 8 hours.   Glucose-Capillary 01/07/2024 102 (H)  70 - 99 mg/dL Final   Glucose reference range applies only to samples taken after fasting for at least 8 hours.    Allergies: Honey bee venom and Pineapple  Medications:  Facility Ordered Medications  Medication   acetaminophen  (TYLENOL ) tablet 650 mg   alum & mag hydroxide-simeth (MAALOX/MYLANTA) 200-200-20 MG/5ML suspension 30 mL   magnesium hydroxide (MILK OF MAGNESIA) suspension 30 mL   thiamine (VITAMIN B1) injection 100 mg   multivitamin with minerals tablet 1 tablet   chlordiazePOXIDE (LIBRIUM) capsule 25 mg   hydrOXYzine  (ATARAX ) tablet 25 mg   loperamide (IMODIUM) capsule 2-4 mg   ondansetron  (ZOFRAN -ODT) disintegrating tablet 4 mg   haloperidol (HALDOL) tablet 5 mg   And   diphenhydrAMINE (BENADRYL) capsule 50 mg   haloperidol lactate (HALDOL) injection 5 mg   And   diphenhydrAMINE (BENADRYL) injection 50 mg   And   LORazepam  (ATIVAN ) injection 2 mg   haloperidol lactate (HALDOL) injection 10 mg   And   diphenhydrAMINE (BENADRYL) injection 50 mg   And   LORazepam  (ATIVAN ) injection 2 mg   PTA Medications  Medication Sig   gabapentin (NEURONTIN) 100 MG capsule Take 100 mg by mouth 2 (two) times daily.   JULUCA 50-25 MG tablet Take 1 tablet by mouth daily.   hydrOXYzine  (ATARAX ) 25 MG tablet Take 25 mg by mouth every 6 (six) hours as needed for anxiety.   QUEtiapine (SEROQUEL) 25 MG tablet Take 25 mg by mouth at bedtime.    Long Term Goals: Improvement in symptoms so as ready for discharge  Short Term Goals: Patient will verbalize feelings in meetings with treatment team members., Patient will attend at least of 50% of the groups daily., Pt will complete the PHQ9 on admission, day 3 and discharge., Patient will participate in completing the [place] Suicide Severity Rating Scale, Patient will score a low risk of violence for 24 hours  prior to discharge, and Patient will take medications as prescribed daily.  Medical Decision Making  [facility] unit    Recommendations  Based on my evaluation the patient does not appear to have an emergency medical condition.  Gaither Pouch, NP 01/08/24  2:42 AM

## 2024-01-08 NOTE — ED Notes (Signed)
 EMTALA reviewed by this RN and physical copy of voluntary consent signed.

## 2024-01-09 DIAGNOSIS — F411 Generalized anxiety disorder: Secondary | ICD-10-CM | POA: Diagnosis not present

## 2024-01-09 DIAGNOSIS — F1014 Alcohol abuse with alcohol-induced mood disorder: Secondary | ICD-10-CM | POA: Diagnosis not present

## 2024-01-09 DIAGNOSIS — Z5902 Unsheltered homelessness: Secondary | ICD-10-CM | POA: Diagnosis not present

## 2024-01-09 MED ORDER — PHENOL 1.4 % MT LIQD
2.0000 | Freq: Three times a day (TID) | OROMUCOSAL | Status: DC | PRN
  Administered 2024-01-09 – 2024-01-10 (×3): 2 via OROMUCOSAL

## 2024-01-09 MED ORDER — METOPROLOL SUCCINATE ER 50 MG PO TB24
50.0000 mg | ORAL_TABLET | Freq: Every day | ORAL | Status: DC
  Administered 2024-01-09: 50 mg via ORAL
  Filled 2024-01-09: qty 1

## 2024-01-09 MED ORDER — HYDROXYZINE HCL 25 MG PO TABS
50.0000 mg | ORAL_TABLET | Freq: Three times a day (TID) | ORAL | Status: DC | PRN
  Administered 2024-01-09 – 2024-01-11 (×6): 50 mg via ORAL
  Filled 2024-01-09 (×6): qty 2

## 2024-01-09 MED ORDER — MENTHOL 3 MG MT LOZG
2.0000 | LOZENGE | Freq: Three times a day (TID) | OROMUCOSAL | Status: DC | PRN
  Administered 2024-01-10: 6 mg via ORAL
  Filled 2024-01-09: qty 18

## 2024-01-09 MED ORDER — PROPRANOLOL HCL 10 MG PO TABS: 10.0000 mg | ORAL_TABLET | Freq: Two times a day (BID) | ORAL | Status: DC

## 2024-01-09 MED ORDER — GABAPENTIN 100 MG PO CAPS
200.0000 mg | ORAL_CAPSULE | Freq: Three times a day (TID) | ORAL | Status: DC
  Administered 2024-01-09 – 2024-01-11 (×7): 200 mg via ORAL
  Filled 2024-01-09 (×7): qty 2

## 2024-01-09 NOTE — ED Notes (Signed)
Patient asleep at this time. NAD.

## 2024-01-09 NOTE — Progress Notes (Signed)
 Patient came to the nurse's station complaining about throat irritation.  This clinical research associate informed him of his Chloraseptic spray as needed for throat irritation and pain.  Patient verbalized understanding of his prn order.  Patient expressed feeling scared and nervous, and he was given therapeutic support regarding his concern.  He was informed we will continue to monitor for any changes.  He verbalized this understanding and stated ok. Will continue to monitor.

## 2024-01-09 NOTE — ED Notes (Signed)
 Assumed care of patient @ 0730, Pt awake having breakfast, interacting with peer, denied thoughts of self harm and denied pain. Pt reported anxiety 4/10, and he continues to have a 72 hr. Of intent to leave intact. Pt also denied having withdrawal sx ans noted noted.

## 2024-01-09 NOTE — ED Provider Notes (Addendum)
 Behavioral Health Progress Note  Date and Time: 01/09/2024 4:53 PM Name: Collin Jones MRN:  [id number]  HPI: Amardeep Beckers is a 28 y.o. male PMH HIV and mental health history of GAD, alcohol use d/o and benzodiazepine use d/o who presented to the [facility] on 10/23 with complaints of worsening substance abuse, and requesting detoxification.  He was assessed and recommended for the [facility] center to which she was transferred on 10/24 for treatment and stabilization.  Patient assessment, 01/09/2024: On assessment today, the pt reports that their mood is still depressed, but improving. Reports that anxiety is still very elevated, he is persistent about wanting benzodiazepine type medications for management of his anxiety, but educated on the fact that this group of medications will not be prescribed for him during his stay here, due to his abuse of this medication types.  He denies any history of seizures or complicated withdrawals from benzodiazepines or alcohol. Sleep is poor as per patient, and that he had difficulty sleeping last night, but is currently on Seroquel, educated that gabapentin will be added to medication regimen at 200 mg 3 times daily, and hopefully the night dose will help him with sleep.  Verbalized understanding, and stated that historically, gabapentin has been helpful for anxiety, as well as hydroxyzine . Appetite is good as per fair. Concentration is fair. Energy level is still low, but improving. Denies suicidal thoughts.  Denies suicidal intent and plan.  Denies having any HI.  Denies having psychotic symptoms.  Denies having side effects to current psychiatric medications.  We discussed changes to current medication regimen, as mentioned above. Discussed the following psychosocial stressors: Socioeconomic stressors which include homelessness, patient possesses borderline personality traits; repeatedly stating that he was  residing with a family member 1 month ago, but was kicked out of the house because they prefer the other kids that me.  Patient however states that he has worked with CSW, and has located a rehabilitation facility in [place]  that is willing to take him on.  He states that he has not been on medications for a while now due to [facility] kicked him out of the practice for yelling.  We talked about the need for rules needing to be followed here at the [facility], to which patient was receptive.  Patient has been educated on the 72-hour notice that he is time requesting discharge, also including the fact that he can be discharged at any time prior to the 72-hour notice expiring, and chose to rescind his noticed today.  Per CSW, Patient  has been accepted to [facility].    Patient at this current moment has accepted the placement.    Patient is scheduled for pick up Monday 01/11/24 by 11:00 am.    Patient will need 7 day supply and scripts.  Patient will therefore be discharged on 10/27.  Diagnosis:  Final diagnoses:  GAD (generalized anxiety disorder)  Alcohol abuse with alcohol-induced mood disorder (HCC)   Total Time spent with patient: 45 minutes  Past Psychiatric History: Alcohol use disorder, depression, anxiety, ADHD, eating disorder Past Medical History: HIV Family History: None reported Family Psychiatric  History: None reported Social History: Reports currently resides in his car at his mother's home, endorses alcohol use, endorses illicit benzodiazepine use, unemployed, 2 interviews for new employment opportunities scheduled for next week   Sleep: Fair  Appetite:  Good  Current Medications:  Current Facility-Administered Medications  Medication Dose Route Frequency Provider Last Rate Last  Admin   acetaminophen  (TYLENOL ) tablet 650 mg  650 mg Oral Q6H PRN Trudy Carwin, NP       alum & mag hydroxide-simeth (MAALOX/MYLANTA) 200-200-20 MG/5ML suspension 30 mL  30 mL Oral Q4H PRN  Trudy Carwin, NP       chlordiazePOXIDE (LIBRIUM) capsule 25 mg  25 mg Oral Q6H PRN Trudy Carwin, NP   25 mg at 01/08/24 0758   haloperidol (HALDOL) tablet 5 mg  5 mg Oral TID PRN Trudy Carwin, NP       And   diphenhydrAMINE (BENADRYL) capsule 50 mg  50 mg Oral TID PRN Trudy Carwin, NP       haloperidol lactate (HALDOL) injection 5 mg  5 mg Intramuscular TID PRN Trudy Carwin, NP       And   diphenhydrAMINE (BENADRYL) injection 50 mg  50 mg Intramuscular TID PRN Trudy Carwin, NP       And   LORazepam  (ATIVAN ) injection 2 mg  2 mg Intramuscular TID PRN Trudy Carwin, NP       haloperidol lactate (HALDOL) injection 10 mg  10 mg Intramuscular TID PRN Trudy Carwin, NP       And   diphenhydrAMINE (BENADRYL) injection 50 mg  50 mg Intramuscular TID PRN Trudy Carwin, NP       And   LORazepam  (ATIVAN ) injection 2 mg  2 mg Intramuscular TID PRN Trudy Carwin, NP       dolutegravir-rilpivirine (JULUCA) 50-25 MG per tablet 1 tablet  1 tablet Oral Daily Dasie Ellouise CROME, FNP   1 tablet at 01/09/24 0927   gabapentin (NEURONTIN) capsule 200 mg  200 mg Oral TID Amando Ishikawa, NP   200 mg at 01/09/24 1649   hydrOXYzine  (ATARAX ) tablet 50 mg  50 mg Oral TID PRN Tex Drilling, NP   50 mg at 01/09/24 1414   loperamide (IMODIUM) capsule 2-4 mg  2-4 mg Oral PRN Trudy Carwin, NP       magnesium hydroxide (MILK OF MAGNESIA) suspension 30 mL  30 mL Oral Daily PRN Trudy Carwin, NP       metoprolol succinate (TOPROL-XL) 24 hr tablet 50 mg  50 mg Oral Q2000 Mellie Buccellato, NP       multivitamin with minerals tablet 1 tablet  1 tablet Oral Daily Trudy Carwin, NP   1 tablet at 01/09/24 1416   ondansetron  (ZOFRAN -ODT) disintegrating tablet 4 mg  4 mg Oral Q6H PRN Trudy Carwin, NP       phenol (CHLORASEPTIC) mouth spray 1 spray  1 spray Mouth/Throat TID PRN Cole Kandi BROCKS, MD   1 spray at 01/09/24 1132   QUEtiapine (SEROQUEL) tablet 50 mg  50 mg Oral QHS Bethea, Terrence C, MD   50 mg at 01/08/24 2034    Current Outpatient Medications  Medication Sig Dispense Refill   gabapentin (NEURONTIN) 100 MG capsule Take 100 mg by mouth 2 (two) times daily.     hydrOXYzine  (ATARAX ) 25 MG tablet Take 25 mg by mouth every 6 (six) hours as needed for anxiety.     JULUCA 50-25 MG tablet Take 1 tablet by mouth daily.     QUEtiapine (SEROQUEL) 25 MG tablet Take 25 mg by mouth at bedtime.      Labs  Lab Results:  Admission on 01/07/2024, Discharged on 01/08/2024  Component Date Value Ref Range Status   Sodium 01/07/2024 140  135 - 145 mmol/L Final   Potassium 01/07/2024 3.4 (L)  3.5 - 5.1 mmol/L Final  Chloride 01/07/2024 104  98 - 111 mmol/L Final   CO2 01/07/2024 20 (L)  22 - 32 mmol/L Final   Glucose, Bld 01/07/2024 75  70 - 99 mg/dL Final   Glucose reference range applies only to samples taken after fasting for at least 8 hours.   BUN 01/07/2024 8  6 - 20 mg/dL Final   Creatinine, Ser 01/07/2024 0.87  0.61 - 1.24 mg/dL Final   Calcium 89/76/7974 9.0  8.9 - 10.3 mg/dL Final   Total Protein 89/76/7974 8.4 (H)  6.5 - 8.1 g/dL Final   Albumin 89/76/7974 4.5  3.5 - 5.0 g/dL Final   AST 89/76/7974 62 (H)  15 - 41 U/L Final   ALT 01/07/2024 39  0 - 44 U/L Final   Alkaline Phosphatase 01/07/2024 65  38 - 126 U/L Final   Total Bilirubin 01/07/2024 0.9  0.0 - 1.2 mg/dL Final   GFR, Estimated 01/07/2024 >60  >60 mL/min Final   Comment: (NOTE) Calculated using the CKD-EPI Creatinine Equation (2021)    Anion gap 01/07/2024 16 (H)  5 - 15 Final   Performed at [facility], [address] [place], [place] [address]   Alcohol, Ethyl (B) 01/07/2024 153 (H)  <15 mg/dL Corrected   Comment: RESULT CALLED TO, READ BACK BY AND VERIFIED WITH: TOM NAGY AT 1744 ON 01/07/24 BY SS (NOTE) For medical purposes only. Performed at [facility], [address] [place], [place] [address] CORRECTED ON 10/23 AT 1750: PREVIOUSLY REPORTED AS <15    WBC 01/07/2024 7.4  4.0 - 10.5 K/uL Final   RBC  01/07/2024 5.12  4.22 - 5.81 MIL/uL Final   Hemoglobin 01/07/2024 15.9  13.0 - 17.0 g/dL Final   HCT 89/76/7974 46.8  39.0 - 52.0 % Final   MCV 01/07/2024 91.4  80.0 - 100.0 fL Final   MCH 01/07/2024 31.1  26.0 - 34.0 pg Final   MCHC 01/07/2024 34.0  30.0 - 36.0 g/dL Final   RDW 89/76/7974 13.2  11.5 - 15.5 % Final   Platelets 01/07/2024 169  150 - 400 K/uL Final   nRBC 01/07/2024 0.0  0.0 - 0.2 % Final   Performed at [facility], [address] [place], [place] [address]   Tricyclic, Ur Screen 01/07/2024 POSITIVE (A)  NONE DETECTED Final   Amphetamines, Ur Screen 01/07/2024 NONE DETECTED  NONE DETECTED Final   MDMA (Ecstasy)Ur Screen 01/07/2024 NONE DETECTED  NONE DETECTED Final   Cocaine Metabolite,Ur [place] 01/07/2024 NONE DETECTED  NONE DETECTED Final   Opiate, Ur Screen 01/07/2024 NONE DETECTED  NONE DETECTED Final   Phencyclidine (PCP) Ur S 01/07/2024 NONE DETECTED  NONE DETECTED Final   Cannabinoid 50 Ng, Ur [place] 01/07/2024 POSITIVE (A)  NONE DETECTED Final   Barbiturates, Ur Screen 01/07/2024 NONE DETECTED  NONE DETECTED Final   Benzodiazepine, Ur Scrn 01/07/2024 POSITIVE (A)  NONE DETECTED Final   Methadone Scn, Ur 01/07/2024 NONE DETECTED  NONE DETECTED Final   Comment: (NOTE) Tricyclics + metabolites, urine    Cutoff 1000 ng/mL Amphetamines + metabolites, urine  Cutoff 1000 ng/mL MDMA (Ecstasy), urine              Cutoff 500 ng/mL Cocaine Metabolite, urine          Cutoff 300 ng/mL Opiate + metabolites, urine        Cutoff 300 ng/mL Phencyclidine (PCP), urine         Cutoff 25 ng/mL Cannabinoid, urine  Cutoff 50 ng/mL Barbiturates + metabolites, urine  Cutoff 200 ng/mL Benzodiazepine, urine              Cutoff 200 ng/mL Methadone, urine                   Cutoff 300 ng/mL  The urine drug screen provides only a preliminary, unconfirmed analytical test result and should not be used for non-medical purposes. Clinical consideration and professional  judgment should be applied to any positive drug screen result due to possible interfering substances. A more specific alternate chemical method must be used in order to obtain a confirmed analytical result. Gas chromatography / mass spectrometry (GC/MS) is the preferred confirm                          atory method. Performed at [facility], [address] [place], [place] [address]    Magnesium 01/07/2024 1.5 (L)  1.7 - 2.4 mg/dL Final   Performed at [facility], [address] [place], [place] [address]   Acetaminophen  (Tylenol ), Serum 01/07/2024 <10 (L)  10 - 30 ug/mL Final   Comment: (NOTE) Therapeutic concentrations vary significantly. A range of 10-30 ug/mL  may be an effective concentration for many patients. However, some  are best treated at concentrations outside of this range. Acetaminophen  concentrations >150 ug/mL at 4 hours after ingestion  and >50 ug/mL at 12 hours after ingestion are often associated with  toxic reactions.  Performed at [facility], [address] [place], [place] [address]    Salicylate Lvl 01/07/2024 <7.0 (L)  7.0 - 30.0 mg/dL Final   Performed at [facility], [address] [place], [place] [address]   Glucose-Capillary 01/07/2024 62 (L)  70 - 99 mg/dL Final   Glucose reference range applies only to samples taken after fasting for at least 8 hours.   Glucose-Capillary 01/07/2024 102 (H)  70 - 99 mg/dL Final   Glucose reference range applies only to samples taken after fasting for at least 8 hours.    Blood Alcohol level:  Lab Results  Component Value Date   ETH 153 (H) 01/07/2024   ETH <10 09/28/2022    Metabolic Disorder Labs: No results found for: HGBA1C, MPG No results found for: PROLACTIN No results found for: CHOL, TRIG, HDL, CHOLHDL, VLDL, LDLCALC  Therapeutic Lab Levels: No results found for: LITHIUM No results found for: VALPROATE No results found for:  CBMZ  Physical Findings   GAD-7    Flowsheet Row Office Visit from 06/02/2023 in [facility]  Total GAD-7 Score 12   PHQ2-9    Flowsheet Row ED from 01/08/2024 in [facility] Office Visit from 06/02/2023 in [facility]  PHQ-2 Total Score 2 2  PHQ-9 Total Score 13 10   Flowsheet Row ED from 01/08/2024 in [facility] ED from 01/07/2024 in [facility] Emergency Department at [facility] ED from 09/28/2022 in [facility] Emergency Department at [facility]  C-SSRS RISK CATEGORY No Risk No Risk No Risk     Musculoskeletal  Strength & Muscle Tone: within normal limits Gait & Station: normal Patient leans: N/A  Psychiatric Specialty Exam  Presentation  General Appearance:  Disheveled  Eye Contact: Fair  Speech: Clear and Coherent  Speech Volume: Normal  Handedness: Right   Mood and Affect  Mood: Depressed; Anxious  Affect: Congruent   Thought Process  Thought  Processes: Coherent  Descriptions of Associations:Intact  Orientation:Full (Time, Place and Person)  Thought Content:Logical     Hallucinations:Hallucinations: None  Ideas of Reference:None  Suicidal Thoughts:Suicidal Thoughts: No  Homicidal Thoughts:Homicidal Thoughts: No   Sensorium  Memory: Immediate Fair  Judgment: Fair  Insight: Fair   Art Therapist  Concentration: Fair  Attention Span: Fair  Recall: [employer] of Knowledge: Fair  Language: Fair   Psychomotor Activity  Psychomotor Activity: Psychomotor Activity: Normal   Assets  Assets: Communication Skills; Resilience   Sleep  Sleep: Sleep: Poor  Estimated Sleeping Duration (Last 24 Hours): 8.25-11.00 hours  Nutritional Assessment (For OBS and FBC admissions only) Has the patient had a weight loss or gain of 10 pounds or more in the last 3 months?: No Has the patient had a decrease in food  intake/or appetite?: No Does the patient have dental problems?: No Does the patient have eating habits or behaviors that may be indicators of an eating disorder including binging or inducing vomiting?: No Has the patient recently lost weight without trying?: 0 Has the patient been eating poorly because of a decreased appetite?: 0 Malnutrition Screening Tool Score: 0    Physical Exam  Physical Exam Constitutional:      Appearance: Normal appearance.  Musculoskeletal:     Cervical back: Normal range of motion.  Neurological:     General: No focal deficit present.     Mental Status: He is alert and oriented to person, place, and time.    Review of Systems  Psychiatric/Behavioral:  Positive for depression and substance abuse. Negative for hallucinations, memory loss and suicidal ideas. The patient is nervous/anxious and has insomnia.   All other systems reviewed and are negative.  Blood pressure (!) 128/94, pulse 78, temperature 97.6 F (36.4 C), resp. rate 17, SpO2 100%. There is no height or weight on file to calculate BMI.  Treatment Plan Summary: Daily contact with patient to assess and evaluate symptoms and progress in treatment and Medication management  - Start gabapentin 200 mg 3 times daily for anxiety - Librium 25 mg as needed every 6 hours for CIWA scores over 10 - Increase hydroxyzine  from 25 to 50 mg 3 times daily as needed for anxiety - Continue Seroquel 50 mg nightly at bedtime for sleep and mood stabilization - Continue Toprol XL 24-hour daily for hypertension/tachycardia - Continue agitation protocol: Haldol/Ativan /Benadryl 3 times daily as needed for agitation-see [facility] for details - Continue Juluca 1 tab daily for HIV disease - Continue Tylenol /milk of mag/Maalox as needed as per the [facility] - Continue Q 15 minute checks for safety  Labs reviewed: Potassium is 3.4, repeating BMP on 10/26 to ensure that this continue to trend upwards.  Ordered hemoglobin A1c, repeat  mag level ordered, as last level was 1.5.  Vitamin B12, vitamin D ordered.  TSH completed in September, currently not needed.  Total Time Spent in Direct Patient Care:  I personally spent 50 minutes on the unit in direct patient care. The direct patient care time included face-to-face time with the patient, reviewing the patient's chart, communicating with other professionals, and coordinating care. Greater than 50% of this time was spent in counseling or coordinating care with the patient regarding goals of hospitalization, psycho-education, and discharge planning needs.   Donia Snell, NP 01/09/2024 4:53 PM

## 2024-01-09 NOTE — ED Notes (Signed)
 Pt continue to request anxiety medication frequently, paces the unit frequently and he denies having any other sx r/t withdrawals. Pt was oriented to the use and frequent of the use of medications.

## 2024-01-09 NOTE — Group Note (Signed)
 Group Topic: Healthy Self Image and Positive Change  Group Date: 01/09/2024 Start Time: 1000 End Time: 1045 Facilitators: Prosper Paff, Renea POUR, NT; Alyse Leilani LABOR, NT  Department: [facility]  Number of Participants: 10  Group Focus: coping skills Treatment Modality:  Psychoeducation Interventions utilized were patient education and problem solving Purpose: enhance coping skills and increase insight  Name: Collin Jones Date of Birth: 10-28-1995  MR: [id number]    Level of Participation: active Quality of Participation: cooperative and engaged Interactions with others: gave feedback Mood/Affect: bright and positive Triggers (if applicable): None Cognition: concrete and insightful Progress: Gaining insight Response:  I am looking forward to a positive recovery.  Plan: patient will be encouraged to attend group.   Patients Problems:  Patient Active Problem List   Diagnosis Date Noted   Alcohol abuse 01/08/2024   HIV (human immunodeficiency virus infection) (HCC) 04/27/2023   Alcohol withdrawal (HCC) 04/11/2023   Depression with anxiety 08/13/2018   History of ADHD 08/13/2018   History of eating disorder 08/13/2018

## 2024-01-09 NOTE — Group Note (Signed)
 Group Topic: Recovery Basics  Group Date: 01/09/2024 Start Time: 2000 End Time: 2100 Facilitators: Anice Benton LABOR, NT  Department: [facility]  Number of Participants: 7  Group Focus: abuse issues, goals/reality orientation, and relapse prevention(AA Group) Treatment Modality:  Solution-Focused Therapy Interventions utilized were group exercise Purpose: increase insight and relapse prevention strategies  Name: Collin Jones Date of Birth: Mar 31, 1995  MR: [id number]    Level of Participation: active Quality of Participation: cooperative Interactions with others: gave feedback Mood/Affect: appropriate and positive Triggers (if applicable): N/A Cognition: coherent/clear Progress: Moderate Response: good Plan: follow-up needed  Patients Problems:  Patient Active Problem List   Diagnosis Date Noted   Alcohol abuse 01/08/2024   HIV (human immunodeficiency virus infection) (HCC) 04/27/2023   Alcohol withdrawal (HCC) 04/11/2023   Depression with anxiety 08/13/2018   History of ADHD 08/13/2018   History of eating disorder 08/13/2018

## 2024-01-09 NOTE — Progress Notes (Signed)
 Patient is currently resting with eyes closed.  No acute distress noted.  Respirations present and unlabored.

## 2024-01-09 NOTE — Progress Notes (Signed)
 Patient is alert and oriented x 4 with no acute distress.  Patient expressed feeling anxious and nervous due to throat irritation, but denies SI/HI/AVH.  Patient observed in the dayroom interacting with peers.  Patient has been slightly intrusive at the nurse's station stating he is anxious but anticipating discharge.  This clinical research associate provided him encouragement.  Patient stated he appreciated the care.

## 2024-01-09 NOTE — ED Notes (Signed)
 Pt became loud and demanding medications, accusing staff of not helping him, patient than started complaining of ETOH withdrawal sx and was noted with sweaty palms and flushed face. Pt was given PRN Librium and patient reported effectiveness and appeared much calmer.

## 2024-01-10 DIAGNOSIS — F411 Generalized anxiety disorder: Secondary | ICD-10-CM | POA: Diagnosis not present

## 2024-01-10 DIAGNOSIS — Z5902 Unsheltered homelessness: Secondary | ICD-10-CM | POA: Diagnosis not present

## 2024-01-10 DIAGNOSIS — F1014 Alcohol abuse with alcohol-induced mood disorder: Secondary | ICD-10-CM | POA: Diagnosis not present

## 2024-01-10 LAB — BASIC METABOLIC PANEL WITH GFR
Anion gap: 14 (ref 5–15)
BUN: <5 mg/dL — ABNORMAL LOW (ref 6–20)
CO2: 27 mmol/L (ref 22–32)
Calcium: 11.1 mg/dL — ABNORMAL HIGH (ref 8.9–10.3)
Chloride: 97 mmol/L — ABNORMAL LOW (ref 98–111)
Creatinine, Ser: 1.03 mg/dL (ref 0.61–1.24)
GFR, Estimated: >60 mL/min (ref >60)
Glucose, Bld: 54 mg/dL — ABNORMAL LOW (ref 70–99)
Potassium: 4.1 mmol/L (ref 3.5–5.1)
Sodium: 138 mmol/L (ref 135–145)

## 2024-01-10 LAB — VITAMIN B12: Vitamin B-12: 296 pg/mL (ref 180–914)

## 2024-01-10 LAB — HEMOGLOBIN A1C
Hgb A1c MFr Bld: 4.5 % — ABNORMAL LOW (ref 4.8–5.6)
Mean Plasma Glucose: 82.45 mg/dL

## 2024-01-10 LAB — MAGNESIUM: Magnesium: 1.9 mg/dL (ref 1.7–2.4)

## 2024-01-10 MED ORDER — ENSURE PLUS HIGH PROTEIN PO LIQD
237.0000 mL | Freq: Two times a day (BID) | ORAL | Status: DC
  Administered 2024-01-10 – 2024-01-11 (×2): 237 mL via ORAL
  Filled 2024-01-10 (×2): qty 237

## 2024-01-10 MED ORDER — PROPRANOLOL HCL 10 MG PO TABS
10.0000 mg | ORAL_TABLET | Freq: Three times a day (TID) | ORAL | Status: DC
  Administered 2024-01-10 – 2024-01-11 (×4): 10 mg via ORAL
  Filled 2024-01-10 (×4): qty 1

## 2024-01-10 NOTE — ED Notes (Signed)
 Paitent provided lunch.

## 2024-01-10 NOTE — ED Notes (Signed)
 Pt apologetic r/t behavior displayed yesterday, pt expressed gratitude for services from staff.  Pt anxious, but pleasant and cooperative. Pt c/o level 3/10 throat pain- given prn lozenge. RN observed white and raw patches on pt tongue- provider notified. Pt given prn atarax  as requested for anxiety. Pt says he is 'anxious' over going to rehab tomorrow, but is ready and willing. Pt ate breakfast. Pt denies si hi and avh- verbal contract for safety provided. Medications reviewed- questions denied.

## 2024-01-10 NOTE — Progress Notes (Signed)
 Patient is currently resting with eyes closed at this time.  No acute distress noted.  Respirations present and unlabored.  No current issues noted.

## 2024-01-10 NOTE — ED Notes (Signed)
 Paitent provided breakfast.

## 2024-01-10 NOTE — ED Notes (Signed)
 Pt continues to present as anxious. Pt gets along well with peers, generally polite and cooperative. Pt ate lunch, also provided with ensure supplement drink as ordered. Anxiety medication orders reviewed with pt.

## 2024-01-10 NOTE — ED Notes (Signed)
 Paitent provided dinner.

## 2024-01-10 NOTE — Group Note (Signed)
 Group Topic: Wellness  Group Date: 01/10/2024 Start Time: 2030 End Time: 2100 Facilitators: Anice Benton LABOR, NT  Department: [facility]  Number of Participants: 6  Group Focus: check in Treatment Modality:  Individual Therapy Interventions utilized were assignment Purpose: increase insight  Name: Collin Jones Date of Birth: 03/25/1995  MR: [id number]    Level of Participation: active Quality of Participation: cooperative Interactions with others: gave feedback Mood/Affect: appropriate Triggers (if applicable): N/A Cognition: goal directed Progress: Moderate Response: good Plan: follow-up needed  Patients Problems:  Patient Active Problem List   Diagnosis Date Noted   Alcohol abuse 01/08/2024   HIV (human immunodeficiency virus infection) (HCC) 04/27/2023   Alcohol withdrawal (HCC) 04/11/2023   Depression with anxiety 08/13/2018   History of ADHD 08/13/2018   History of eating disorder 08/13/2018

## 2024-01-10 NOTE — Group Note (Signed)
 Group Topic: Understanding Self  Group Date: 01/10/2024 Start Time: 1200 End Time: 1230 Facilitators: Daved Tinnie HERO, RN  Department: [facility]  Number of Participants: 8  Group Focus: nursing group Treatment Modality:  Psychoeducation Interventions utilized were exploration Purpose: increase insight  Name: Efraim Vanallen Date of Birth: [birth year]  MR: [id number]    Level of Participation: moderate Quality of Participation: attentive and cooperative Interactions with others: gave feedback Mood/Affect: appropriate Triggers (if applicable): n/a Cognition: coherent/clear Progress: Gaining insight Response: to align their actions with their personal values, pt says they will focus on mental health, take his medications, go to therapy, be honest Plan: patient will be encouraged to attend future RN education groups   Patients Problems:  Patient Active Problem List   Diagnosis Date Noted   Alcohol abuse 01/08/2024   HIV (human immunodeficiency virus infection) (HCC) 04/27/2023   Alcohol withdrawal (HCC) 04/11/2023   Depression with anxiety 08/13/2018   History of ADHD 08/13/2018   History of eating disorder 08/13/2018

## 2024-01-10 NOTE — ED Provider Notes (Signed)
 Behavioral Health Progress Note  Date and Time: 01/10/2024 12:13 PM Name: Collin Jones MRN:  [id number]  Subjective:  Collin Jones is in a much better mood this morning. He effusively apologizes for previous day(s) behavior. He feels much better after finally getting a good nights sleep. He notes two priorities (address current health concerns and ensure he gets on transport to [facility] tomorrow). Although mood/anxiety/health status have improved he notes prominent anxiety which he attributes to withdrawal from benzodiazepines. He did get a Librium dose yesterday afternoon (PRN CIWA) but he recognizes benzodiazepines will not be accessible. He is open to trial of propranolol as replacement for metoprolol. He also has concerns about several days of mouth/throat pain that impacts his ability to eat. He requests supplemental nutrition to help mitigate potential macro/micronutrient deficiencies. He denies fever but does report history of strep infections.   Diagnosis:  Final diagnoses:  GAD (generalized anxiety disorder)  Alcohol abuse with alcohol-induced mood disorder (HCC)   Total Time spent with patient: I personally spent 40 minutes on the unit in direct patient care. The direct patient care time included face-to-face time with the patient, reviewing the patient's chart, communicating with other professionals, and coordinating care. Greater than 50% of this time was spent in counseling or coordinating care with the patient regarding goals of hospitalization, psycho-education, and discharge planning needs. Physical exam notable for plaques on tongue, 3 white papules on soft palate, and viscous material along oropharynx particularly posterior to right tonsil.    On my assessment the patient denied SI, HI, AVH, paranoia, ideas of reference, or first rank symptoms on day of discharge. Patient endorses tremor as indication of active signs of withdrawal. Patient denied medication side-effects.     Sleep:  Good  Appetite:  Poor  Current Medications:  Current Facility-Administered Medications  Medication Dose Route Frequency Provider Last Rate Last Admin   acetaminophen  (TYLENOL ) tablet 650 mg  650 mg Oral Q6H PRN Trudy Carwin, NP       alum & mag hydroxide-simeth (MAALOX/MYLANTA) 200-200-20 MG/5ML suspension 30 mL  30 mL Oral Q4H PRN Trudy Carwin, NP       chlordiazePOXIDE (LIBRIUM) capsule 25 mg  25 mg Oral Q6H PRN Trudy Carwin, NP   25 mg at 01/09/24 1716   haloperidol (HALDOL) tablet 5 mg  5 mg Oral TID PRN Trudy Carwin, NP       And   diphenhydrAMINE (BENADRYL) capsule 50 mg  50 mg Oral TID PRN Trudy Carwin, NP       haloperidol lactate (HALDOL) injection 5 mg  5 mg Intramuscular TID PRN Trudy Carwin, NP       And   diphenhydrAMINE (BENADRYL) injection 50 mg  50 mg Intramuscular TID PRN Trudy Carwin, NP       And   LORazepam  (ATIVAN ) injection 2 mg  2 mg Intramuscular TID PRN Trudy Carwin, NP       haloperidol lactate (HALDOL) injection 10 mg  10 mg Intramuscular TID PRN Trudy Carwin, NP       And   diphenhydrAMINE (BENADRYL) injection 50 mg  50 mg Intramuscular TID PRN Trudy Carwin, NP       And   LORazepam  (ATIVAN ) injection 2 mg  2 mg Intramuscular TID PRN Trudy Carwin, NP       dolutegravir-rilpivirine (JULUCA) 50-25 MG per tablet 1 tablet  1 tablet Oral Daily Dasie Ellouise CROME, FNP   1 tablet at 01/10/24 0903   feeding supplement (ENSURE PLUS HIGH PROTEIN) liquid  237 mL  237 mL Oral BID BM Itzae Miralles C, MD       gabapentin (NEURONTIN) capsule 200 mg  200 mg Oral TID Nkwenti, Doris, NP   200 mg at 01/10/24 9095   hydrOXYzine  (ATARAX ) tablet 50 mg  50 mg Oral TID PRN Tex Drilling, NP   50 mg at 01/10/24 0903   loperamide (IMODIUM) capsule 2-4 mg  2-4 mg Oral PRN Trudy Carwin, NP       magnesium hydroxide (MILK OF MAGNESIA) suspension 30 mL  30 mL Oral Daily PRN Trudy Carwin, NP       menthol (CEPACOL) lozenge 6 mg  2 lozenge Oral TID PRN Tenille Morrill C, MD   6  mg at 01/10/24 9094   multivitamin with minerals tablet 1 tablet  1 tablet Oral Daily Trudy Carwin, NP   1 tablet at 01/09/24 1416   ondansetron  (ZOFRAN -ODT) disintegrating tablet 4 mg  4 mg Oral Q6H PRN Trudy Carwin, NP       phenol (CHLORASEPTIC) mouth spray 2 spray  2 spray Mouth/Throat TID PRN Cole Kandi BROCKS, MD   2 spray at 01/10/24 1203   propranolol (INDERAL) tablet 10 mg  10 mg Oral TID Teniya Filter C, MD   10 mg at 01/10/24 1200   QUEtiapine (SEROQUEL) tablet 50 mg  50 mg Oral QHS Chiana Wamser C, MD   50 mg at 01/09/24 2129   Current Outpatient Medications  Medication Sig Dispense Refill   gabapentin (NEURONTIN) 100 MG capsule Take 100 mg by mouth 2 (two) times daily.     hydrOXYzine  (ATARAX ) 25 MG tablet Take 25 mg by mouth every 6 (six) hours as needed for anxiety.     JULUCA 50-25 MG tablet Take 1 tablet by mouth daily.     QUEtiapine (SEROQUEL) 25 MG tablet Take 25 mg by mouth at bedtime.      Labs  Lab Results:  Admission on 01/07/2024, Discharged on 01/08/2024  Component Date Value Ref Range Status   Sodium 01/07/2024 140  135 - 145 mmol/L Final   Potassium 01/07/2024 3.4 (L)  3.5 - 5.1 mmol/L Final   Chloride 01/07/2024 104  98 - 111 mmol/L Final   CO2 01/07/2024 20 (L)  22 - 32 mmol/L Final   Glucose, Bld 01/07/2024 75  70 - 99 mg/dL Final   Glucose reference range applies only to samples taken after fasting for at least 8 hours.   BUN 01/07/2024 8  6 - 20 mg/dL Final   Creatinine, Ser 01/07/2024 0.87  0.61 - 1.24 mg/dL Final   Calcium 89/76/7974 9.0  8.9 - 10.3 mg/dL Final   Total Protein 89/76/7974 8.4 (H)  6.5 - 8.1 g/dL Final   Albumin 89/76/7974 4.5  3.5 - 5.0 g/dL Final   AST 89/76/7974 62 (H)  15 - 41 U/L Final   ALT 01/07/2024 39  0 - 44 U/L Final   Alkaline Phosphatase 01/07/2024 65  38 - 126 U/L Final   Total Bilirubin 01/07/2024 0.9  0.0 - 1.2 mg/dL Final   GFR, Estimated 01/07/2024 >60  >60 mL/min Final   Comment: (NOTE) Calculated using  the CKD-EPI Creatinine Equation (2021)    Anion gap 01/07/2024 16 (H)  5 - 15 Final   Performed at [facility], [address] [place], [place] [address]   Alcohol, Ethyl (B) 01/07/2024 153 (H)  <15 mg/dL Corrected   Comment: RESULT CALLED TO, READ BACK BY AND VERIFIED WITH: TOM NAGY AT  1744 ON 01/07/24 BY SS (NOTE) For medical purposes only. Performed at [facility], [address] [place], [place] [address] CORRECTED ON 10/23 AT 1750: PREVIOUSLY REPORTED AS <15    WBC 01/07/2024 7.4  4.0 - 10.5 K/uL Final   RBC 01/07/2024 5.12  4.22 - 5.81 MIL/uL Final   Hemoglobin 01/07/2024 15.9  13.0 - 17.0 g/dL Final   HCT 89/76/7974 46.8  39.0 - 52.0 % Final   MCV 01/07/2024 91.4  80.0 - 100.0 fL Final   MCH 01/07/2024 31.1  26.0 - 34.0 pg Final   MCHC 01/07/2024 34.0  30.0 - 36.0 g/dL Final   RDW 89/76/7974 13.2  11.5 - 15.5 % Final   Platelets 01/07/2024 169  150 - 400 K/uL Final   nRBC 01/07/2024 0.0  0.0 - 0.2 % Final   Performed at [facility], [address] [place], [place] [address]   Tricyclic, Ur Screen 01/07/2024 POSITIVE (A)  NONE DETECTED Final   Amphetamines, Ur Screen 01/07/2024 NONE DETECTED  NONE DETECTED Final   MDMA (Ecstasy)Ur Screen 01/07/2024 NONE DETECTED  NONE DETECTED Final   Cocaine Metabolite,Ur [place] 01/07/2024 NONE DETECTED  NONE DETECTED Final   Opiate, Ur Screen 01/07/2024 NONE DETECTED  NONE DETECTED Final   Phencyclidine (PCP) Ur S 01/07/2024 NONE DETECTED  NONE DETECTED Final   Cannabinoid 50 Ng, Ur [place] 01/07/2024 POSITIVE (A)  NONE DETECTED Final   Barbiturates, Ur Screen 01/07/2024 NONE DETECTED  NONE DETECTED Final   Benzodiazepine, Ur Scrn 01/07/2024 POSITIVE (A)  NONE DETECTED Final   Methadone Scn, Ur 01/07/2024 NONE DETECTED  NONE DETECTED Final   Comment: (NOTE) Tricyclics + metabolites, urine    Cutoff 1000 ng/mL Amphetamines + metabolites, urine  Cutoff 1000 ng/mL MDMA (Ecstasy), urine              Cutoff 500  ng/mL Cocaine Metabolite, urine          Cutoff 300 ng/mL Opiate + metabolites, urine        Cutoff 300 ng/mL Phencyclidine (PCP), urine         Cutoff 25 ng/mL Cannabinoid, urine                 Cutoff 50 ng/mL Barbiturates + metabolites, urine  Cutoff 200 ng/mL Benzodiazepine, urine              Cutoff 200 ng/mL Methadone, urine                   Cutoff 300 ng/mL  The urine drug screen provides only a preliminary, unconfirmed analytical test result and should not be used for non-medical purposes. Clinical consideration and professional judgment should be applied to any positive drug screen result due to possible interfering substances. A more specific alternate chemical method must be used in order to obtain a confirmed analytical result. Gas chromatography / mass spectrometry (GC/MS) is the preferred confirm                          atory method. Performed at [facility], [address] [place], [place] [address]    Magnesium 01/07/2024 1.5 (L)  1.7 - 2.4 mg/dL Final   Performed at [facility], [address] [place], [place] [address]   Acetaminophen  (Tylenol ), Serum 01/07/2024 <10 (L)  10 - 30 ug/mL Final   Comment: (NOTE) Therapeutic concentrations vary significantly. A range of 10-30 ug/mL  may be an effective concentration  for many patients. However, some  are best treated at concentrations outside of this range. Acetaminophen  concentrations >150 ug/mL at 4 hours after ingestion  and >50 ug/mL at 12 hours after ingestion are often associated with  toxic reactions.  Performed at [facility], [address] [place], [place] [address]    Salicylate Lvl 01/07/2024 <7.0 (L)  7.0 - 30.0 mg/dL Final   Performed at [facility], [address] [place], [place] [address]   Glucose-Capillary 01/07/2024 62 (L)  70 - 99 mg/dL Final   Glucose reference range applies only to samples taken after fasting for at least 8 hours.    Glucose-Capillary 01/07/2024 102 (H)  70 - 99 mg/dL Final   Glucose reference range applies only to samples taken after fasting for at least 8 hours.    Blood Alcohol level:  Lab Results  Component Value Date   ETH 153 (H) 01/07/2024   ETH <10 09/28/2022    Metabolic Disorder Labs: No results found for: HGBA1C, MPG No results found for: PROLACTIN No results found for: CHOL, TRIG, HDL, CHOLHDL, VLDL, LDLCALC  Therapeutic Lab Levels: No results found for: LITHIUM No results found for: VALPROATE No results found for: CBMZ  Physical Findings   GAD-7    Flowsheet Row Office Visit from 06/02/2023 in [facility]  Total GAD-7 Score 12   PHQ2-9    Flowsheet Row ED from 01/08/2024 in [facility] Office Visit from 06/02/2023 in [facility]  PHQ-2 Total Score 0 2  PHQ-9 Total Score 0 10   Flowsheet Row ED from 01/08/2024 in [facility] ED from 01/07/2024 in [facility] Emergency Department at [facility] ED from 09/28/2022 in [facility] Emergency Department at [facility]  C-SSRS RISK CATEGORY No Risk No Risk No Risk     Musculoskeletal  Strength & Muscle Tone: within normal limits Gait & Station: normal Patient leans: N/A  Psychiatric Specialty Exam  Presentation  General Appearance:  Appropriate for Environment  Eye Contact: Good  Speech: Clear and Coherent  Speech Volume: Normal  Handedness: Right   Mood and Affect  Mood: Anxious; Euthymic  Affect: Congruent   Thought Process  Thought Processes: Coherent  Descriptions of Associations:Intact  Orientation:Full (Time, Place and Person)  Thought Content:Logical     Hallucinations:Hallucinations: None  Ideas of Reference:None  Suicidal Thoughts:Suicidal Thoughts: No  Homicidal Thoughts:Homicidal Thoughts: No   Sensorium  Memory: Immediate Good; Recent Fair; Remote  Fair  Judgment: Good  Insight: Good   Executive Functions  Concentration: Fair  Attention Span: Fair  Recall: Fair  Fund of Knowledge: Good  Language: Good   Psychomotor Activity  Psychomotor Activity: Psychomotor Activity: Normal   Assets  Assets: Communication Skills; Desire for Improvement; Resilience   Sleep  Sleep: Sleep: Good  Estimated Sleeping Duration (Last 24 Hours): 9.50-10.25 hours  No data recorded  Physical Exam  Physical Exam ROS Blood pressure (!) 148/99, pulse 79, temperature 99.1 F (37.3 C), temperature source Oral, resp. rate 14, SpO2 99%. There is no height or weight on file to calculate BMI.  Treatment Plan Summary: Daily contact with patient to assess and evaluate symptoms and progress in treatment and Medication management.Order GAS POC rapid. Order Ensure BID. Replace metoprolol with propranolol 10mg  TID for anxiety. Consider add-on labs for potential nutritional deficiencies vs infectious etiology for oropharynx pathology. Monitor supplemental micronutrients given Ca/Fe in MVI and Ensure may negatively impact Juluca.  Eve Rey JAYSON HAHN,  MD 01/10/2024 12:13 PM

## 2024-01-10 NOTE — ED Notes (Signed)
 Pt reports that he is not feeling well, like he is getting sick. Pt c/o mild body aches, declines prn tylenol . Tempt 99.2, provider notified and is talking with provider at time of this note.

## 2024-01-10 NOTE — Group Note (Signed)
 Group Topic: Healthy Self Image and Positive Change  Group Date: 01/10/2024 Start Time: 1700 End Time: 1800 Facilitators: Nena Lesley PARAS, NT  Department: [facility]  Number of Participants: 8  Group Focus: check in, clarity of thought, and feeling awareness/expression Treatment Modality:  Psychoeducation Interventions utilized were clarification, exploration, and problem solving Purpose: explore maladaptive thinking, express feelings, and increase insight  Name: Collin Jones Date of Birth: 12/21/1995  MR: [id number]    Level of Participation: active Quality of Participation: engaged Interactions with others: gave feedback Mood/Affect: appropriate Triggers (if applicable): N/A Cognition: coherent/clear Progress: Gaining insight Response: Patient gave feedback throughout the group by asking questions and giving personal examples. Plan: patient will be encouraged to continue coming to groups  Patients Problems:  Patient Active Problem List   Diagnosis Date Noted   Alcohol abuse 01/08/2024   HIV (human immunodeficiency virus infection) (HCC) 04/27/2023   Alcohol withdrawal (HCC) 04/11/2023   Depression with anxiety 08/13/2018   History of ADHD 08/13/2018   History of eating disorder 08/13/2018

## 2024-01-10 NOTE — Progress Notes (Addendum)
 Patient is alert and orient x 4 with no acute distress noted.  Patient denies SI/HI/AVH, and appears calm and cooperative with this clinical research associate. Patient observed with withdrawal symptoms with a CIWA score of 11 due to elevated anxiety, pin and needles sensation, slight tremors, and sweating on forehead and palms.  Patient has been observed in the dayroom interacting with peers and watching TV.  PRN Librium 25mg  given for withdrawal symptoms.  Patient stated to this writer, he was feeling anxious and nervous with anticipation regarding discharge.  Patient was also given PRN Hydroxyzine  50 mg at 1945, and was given therapeutic support.  Patient was grateful, and will notify staff with any concerns he may have regarding increased anxiety or other symptoms.  Will continue to monitor Q 15 min safety checks.

## 2024-01-10 NOTE — ED Notes (Signed)
 Pt administered prn throat spray for c/o throat pain.

## 2024-01-11 DIAGNOSIS — Z5902 Unsheltered homelessness: Secondary | ICD-10-CM | POA: Diagnosis not present

## 2024-01-11 DIAGNOSIS — F1014 Alcohol abuse with alcohol-induced mood disorder: Secondary | ICD-10-CM | POA: Diagnosis not present

## 2024-01-11 DIAGNOSIS — F411 Generalized anxiety disorder: Secondary | ICD-10-CM | POA: Diagnosis not present

## 2024-01-11 MED ORDER — HYDROXYZINE HCL 50 MG PO TABS: 50.0000 mg | ORAL_TABLET | Freq: Three times a day (TID) | ORAL | 0 refills | Status: DC | PRN

## 2024-01-11 MED ORDER — PROPRANOLOL HCL 10 MG PO TABS: 10.0000 mg | ORAL_TABLET | Freq: Three times a day (TID) | ORAL | 0 refills | Status: AC

## 2024-01-11 MED ORDER — GABAPENTIN 100 MG PO CAPS: 300.0000 mg | ORAL_CAPSULE | Freq: Three times a day (TID) | ORAL | 0 refills | Status: DC

## 2024-01-11 MED ORDER — QUETIAPINE FUMARATE 50 MG PO TABS: 50.0000 mg | ORAL_TABLET | Freq: Every day | ORAL | 0 refills | Status: DC

## 2024-01-11 MED ORDER — PROPRANOLOL HCL 10 MG PO TABS: 10.0000 mg | ORAL_TABLET | Freq: Three times a day (TID) | ORAL | 0 refills | Status: DC

## 2024-01-11 MED ORDER — QUETIAPINE FUMARATE 50 MG PO TABS: 50.0000 mg | ORAL_TABLET | Freq: Every day | ORAL | 0 refills | Status: AC

## 2024-01-11 MED ORDER — GABAPENTIN 100 MG PO CAPS: 300.0000 mg | ORAL_CAPSULE | Freq: Three times a day (TID) | ORAL | 0 refills | Status: AC

## 2024-01-11 MED ORDER — ADULT MULTIVITAMIN W/MINERALS CH: 1.0000 | ORAL_TABLET | Freq: Every day | ORAL | Status: AC

## 2024-01-11 MED ORDER — JULUCA 50-25 MG PO TABS: 1.0000 | ORAL_TABLET | Freq: Every day | ORAL | 0 refills | Status: AC

## 2024-01-11 MED ORDER — JULUCA 50-25 MG PO TABS: 1.0000 | ORAL_TABLET | Freq: Every day | ORAL | 0 refills | Status: DC

## 2024-01-11 MED ORDER — HYDROXYZINE HCL 50 MG PO TABS: 50.0000 mg | ORAL_TABLET | Freq: Three times a day (TID) | ORAL | 0 refills | Status: AC | PRN

## 2024-01-11 NOTE — ED Provider Notes (Incomplete)
 FBC/OBS ASAP Discharge Summary  Date and Time: 01/11/2024 1:19 PM  Name: Collin Jones  MRN:  [id number]   Discharge Diagnoses:  Final diagnoses:  GAD (generalized anxiety disorder)  Alcohol abuse with alcohol-induced mood disorder (HCC)    Subjective: ***  Stay Summary: ***  Total Time spent with patient: {Time; 15 min - 8 hours:17441}  Past Psychiatric History: *** Past Medical History: *** Family History: *** Family Psychiatric History: *** Social History: *** Tobacco Cessation:  {Discharge tobacco cessation prescription:304700209}  Current Medications:  Current Facility-Administered Medications  Medication Dose Route Frequency Provider Last Rate Last Admin   acetaminophen  (TYLENOL ) tablet 650 mg  650 mg Oral Q6H PRN Trudy Carwin, NP       alum & mag hydroxide-simeth (MAALOX/MYLANTA) 200-200-20 MG/5ML suspension 30 mL  30 mL Oral Q4H PRN Trudy Carwin, NP       haloperidol (HALDOL) tablet 5 mg  5 mg Oral TID PRN Trudy Carwin, NP       And   diphenhydrAMINE (BENADRYL) capsule 50 mg  50 mg Oral TID PRN Trudy Carwin, NP       haloperidol lactate (HALDOL) injection 5 mg  5 mg Intramuscular TID PRN Trudy Carwin, NP       And   diphenhydrAMINE (BENADRYL) injection 50 mg  50 mg Intramuscular TID PRN Trudy Carwin, NP       And   LORazepam  (ATIVAN ) injection 2 mg  2 mg Intramuscular TID PRN Trudy Carwin, NP       haloperidol lactate (HALDOL) injection 10 mg  10 mg Intramuscular TID PRN Trudy Carwin, NP       And   diphenhydrAMINE (BENADRYL) injection 50 mg  50 mg Intramuscular TID PRN Trudy Carwin, NP       And   LORazepam  (ATIVAN ) injection 2 mg  2 mg Intramuscular TID PRN Trudy Carwin, NP       dolutegravir-rilpivirine (JULUCA) 50-25 MG per tablet 1 tablet  1 tablet Oral Daily Dasie Ellouise CROME, FNP   1 tablet at 01/11/24 0908   feeding supplement (ENSURE PLUS HIGH PROTEIN) liquid 237 mL  237 mL Oral BID BM Cole Kandi BROCKS, MD   237 mL at 01/11/24 0908   gabapentin  (NEURONTIN) capsule 200 mg  200 mg Oral TID Tyleek Smick, NP   200 mg at 01/11/24 9091   hydrOXYzine  (ATARAX ) tablet 50 mg  50 mg Oral TID PRN Tex Drilling, NP   50 mg at 01/11/24 9090   magnesium hydroxide (MILK OF MAGNESIA) suspension 30 mL  30 mL Oral Daily PRN Trudy Carwin, NP       menthol (CEPACOL) lozenge 6 mg  2 lozenge Oral TID PRN Cole Kandi BROCKS, MD   6 mg at 01/10/24 9094   multivitamin with minerals tablet 1 tablet  1 tablet Oral Daily Trudy Carwin, NP   1 tablet at 01/10/24 1236   phenol (CHLORASEPTIC) mouth spray 2 spray  2 spray Mouth/Throat TID PRN Cole Kandi BROCKS, MD   2 spray at 01/10/24 1650   propranolol (INDERAL) tablet 10 mg  10 mg Oral TID Bethea, Terrence C, MD   10 mg at 01/11/24 0908   QUEtiapine (SEROQUEL) tablet 50 mg  50 mg Oral QHS Bethea, Terrence C, MD   50 mg at 01/10/24 2105   Current Outpatient Medications  Medication Sig Dispense Refill   [START ON 01/12/2024] dolutegravir-rilpivirine (JULUCA) 50-25 MG tablet Take 1 tablet by mouth daily. 30 tablet 0   gabapentin (NEURONTIN) 100  MG capsule Take 3 capsules (300 mg total) by mouth 3 (three) times daily. 270 capsule 0   hydrOXYzine  (ATARAX ) 50 MG tablet Take 1 tablet (50 mg total) by mouth 3 (three) times daily as needed for anxiety. 30 tablet 0   Multiple Vitamin (MULTIVITAMIN WITH MINERALS) TABS tablet Take 1 tablet by mouth daily.     propranolol (INDERAL) 10 MG tablet Take 1 tablet (10 mg total) by mouth 3 (three) times daily. 90 tablet 0   QUEtiapine (SEROQUEL) 50 MG tablet Take 1 tablet (50 mg total) by mouth at bedtime. 30 tablet 0    PTA Medications:  PTA Medications  Medication Sig   Multiple Vitamin (MULTIVITAMIN WITH MINERALS) TABS tablet Take 1 tablet by mouth daily.   propranolol (INDERAL) 10 MG tablet Take 1 tablet (10 mg total) by mouth 3 (three) times daily.   hydrOXYzine  (ATARAX ) 50 MG tablet Take 1 tablet (50 mg total) by mouth 3 (three) times daily as needed for anxiety.    [START ON 01/12/2024] dolutegravir-rilpivirine (JULUCA) 50-25 MG tablet Take 1 tablet by mouth daily.   QUEtiapine (SEROQUEL) 50 MG tablet Take 1 tablet (50 mg total) by mouth at bedtime.   gabapentin (NEURONTIN) 100 MG capsule Take 3 capsules (300 mg total) by mouth 3 (three) times daily.   Facility Ordered Medications  Medication   acetaminophen  (TYLENOL ) tablet 650 mg   alum & mag hydroxide-simeth (MAALOX/MYLANTA) 200-200-20 MG/5ML suspension 30 mL   magnesium hydroxide (MILK OF MAGNESIA) suspension 30 mL   [COMPLETED] thiamine (VITAMIN B1) injection 100 mg   multivitamin with minerals tablet 1 tablet   [EXPIRED] chlordiazePOXIDE (LIBRIUM) capsule 25 mg   [EXPIRED] loperamide (IMODIUM) capsule 2-4 mg   [EXPIRED] ondansetron  (ZOFRAN -ODT) disintegrating tablet 4 mg   haloperidol (HALDOL) tablet 5 mg   And   diphenhydrAMINE (BENADRYL) capsule 50 mg   haloperidol lactate (HALDOL) injection 5 mg   And   diphenhydrAMINE (BENADRYL) injection 50 mg   And   LORazepam  (ATIVAN ) injection 2 mg   haloperidol lactate (HALDOL) injection 10 mg   And   diphenhydrAMINE (BENADRYL) injection 50 mg   And   LORazepam  (ATIVAN ) injection 2 mg   dolutegravir-rilpivirine (JULUCA) 50-25 MG per tablet 1 tablet   QUEtiapine (SEROQUEL) tablet 50 mg   gabapentin (NEURONTIN) capsule 200 mg   hydrOXYzine  (ATARAX ) tablet 50 mg   phenol (CHLORASEPTIC) mouth spray 2 spray   menthol (CEPACOL) lozenge 6 mg   propranolol (INDERAL) tablet 10 mg   feeding supplement (ENSURE PLUS HIGH PROTEIN) liquid 237 mL       01/11/2024    9:52 AM 01/09/2024    6:25 PM 01/08/2024    2:28 AM  Depression screen PHQ 2/9  Decreased Interest 1 0 1  Down, Depressed, Hopeless 1 0 1  PHQ - 2 Score 2 0 2  Altered sleeping 1 0 3  Tired, decreased energy 1 0 2  Change in appetite 1 0 2  Feeling bad or failure about yourself  1 0 2  Trouble concentrating 1 0 2  Moving slowly or fidgety/restless 1 0 0  Suicidal thoughts 1 0 0   PHQ-9 Score 9 0 13  Difficult doing work/chores Somewhat difficult Not difficult at all     Flowsheet Row ED from 01/08/2024 in [facility] ED from 01/07/2024 in [facility] Emergency Department at [facility] ED from 09/28/2022 in [facility] Emergency Department at [facility]  C-SSRS RISK CATEGORY Error: Q3, 4,  or 5 should not be populated when Q2 is No No Risk No Risk    Musculoskeletal  Strength & Muscle Tone: {desc; muscle tone:32375} Gait & Station: {PE GAIT ED WJUO:77474} Patient leans: {Patient Leans:21022755}  Psychiatric Specialty Exam  Presentation  General Appearance:  Casual  Eye Contact: Fair  Speech: Clear and Coherent  Speech Volume: Normal  Handedness: Right   Mood and Affect  Mood: Euthymic  Affect: Congruent   Thought Process  Thought Processes: Coherent  Descriptions of Associations:Intact  Orientation:Full (Time, Place and Person)  Thought Content:Logical     Hallucinations:Hallucinations: None  Ideas of Reference:None  Suicidal Thoughts:Suicidal Thoughts: No  Homicidal Thoughts:Homicidal Thoughts: No   Sensorium  Memory: Immediate Fair  Judgment: Fair  Insight: Good   Executive Functions  Concentration: Fair  Attention Span: Good  Recall: Good  Fund of Knowledge: Good  Language: Good   Psychomotor Activity  Psychomotor Activity: Psychomotor Activity: Normal   Assets  Assets: Resilience   Sleep  Sleep: Sleep: Good  Estimated Sleeping Duration (Last 24 Hours): 8.25-10.00 hours  Nutritional Assessment (For OBS and FBC admissions only) Has the patient had a weight loss or gain of 10 pounds or more in the last 3 months?: No Has the patient had a decrease in food intake/or appetite?: No Does the patient have dental problems?: No Does the patient have eating habits or behaviors that may be indicators of an eating disorder including binging or inducing  vomiting?: No Has the patient recently lost weight without trying?: 0 Has the patient been eating poorly because of a decreased appetite?: 0 Malnutrition Screening Tool Score: 0    Physical Exam  Physical Exam ROS Blood pressure (!) 141/98, pulse 85, temperature 97.7 F (36.5 C), temperature source Oral, resp. rate 19, SpO2 98%. There is no height or weight on file to calculate BMI.  Demographic Factors:  {Demographic Factors:20662}  Loss Factors: {Loss Factors:20659}  Historical Factors: {Historical Factors:20660}  Risk Reduction Factors:   {Risk Reduction Factors:20661}  Continued Clinical Symptoms:  {Clinical Factors:22706}  Cognitive Features That Contribute To Risk:  {chl bhh Cognitive Features:304700251}    Suicide Risk:  {BHH SUICIDE MPDX:77295}  Plan Of Care/Follow-up recommendations:  {BHH DC FU RECOMMENDATIONS:22620}  Disposition: ***  Donia Snell, NP 01/11/2024, 1:19 PM

## 2024-01-11 NOTE — ED Provider Notes (Signed)
 [facility] Discharge Summary  Date and Time: 01/11/2024 1:19 PM Name: Collin Jones MRN:  [id number]  HPI: Collin Jones is a 28 y.o. male PMH HIV and mental health history of GAD, alcohol use d/o and benzodiazepine use d/o who presented to the [facility] on 10/23 with complaints of worsening substance abuse, and requesting detoxification.  He was assessed and recommended for the [facility] center to which she was transferred on 10/24 for treatment and stabilization.   Patient seen & assessed prior to discharge on 01/11/24:   Pt reports I'm just nervous and a little scared, about going to rehab. Pt reports that his sleep was good and no withdrawal symptoms were reported or noticed. Pt reports that his scheduled gabapentin has been helping him and that it would help with his anxiety about rehab. Pt denied any medication side effects. Pt denies SI/HI, plan or intent. Pt also denies AVH and psychosis. There are no current signs of psychosis, no overt withdrawal symptoms that are substance related are noted. He verbalizes motivation to get better, and to maintain his sobriety.  We talked about his medications; benefits, rationales and possible side effects were discussed, patient had the opportunity to ask questions, and all were answered to his understanding.  He denied having additional questions prior to leaving the facility. Labs were reviewed with the patient, and abnormal results were discussed with the patient.  The patient was able to verbalize their individual safety plan to this provider as follows:SABRA  # It is recommended to the patient to continue psychiatric medications as prescribed, after discharge from the hospital.    # It is recommended to the patient to follow up with your outpatient psychiatric provider and PCP.  # It was discussed with the patient, the impact of alcohol, drugs, tobacco have been there overall psychiatric and medical wellbeing,  and total abstinence from substance use was recommended the patient.ed.  # Prescriptions provided or sent directly to preferred pharmacy at discharge. Patient agreeable to plan. Given opportunity to ask questions. Appears to feel comfortable with discharge.    # In the event of worsening symptoms, the patient is instructed to call the crisis hotline, [phone] and or go to the nearest ED for appropriate evaluation and treatment of symptoms. To follow-up with primary care provider for other medical issues, concerns and or health care needs  # Patient was discharged to the [facility].  Transportation provided by the program.  Diagnosis:  Final diagnoses:  GAD (generalized anxiety disorder)  Alcohol abuse with alcohol-induced mood disorder (HCC)    Total Time spent with patient: 45 minutes  Past Psychiatric History: Alcohol use disorder, Depression, Anxiety, ADHD, eating disorder Past Medical History: HIV Family History: None reported Family Psychiatric  History: None reported Social History: Reports he currently lives in his car at his mother's home, endorses alcohol use, illicit benzodiazapine use, unemployment, 2 interviews for new employment opportunities scheduled for next week.     Sleep: Good  Appetite:  Good  Current Medications:  Current Facility-Administered Medications  Medication Dose Route Frequency Provider Last Rate Last Admin   acetaminophen  (TYLENOL ) tablet 650 mg  650 mg Oral Q6H PRN Trudy Carwin, NP       alum & mag hydroxide-simeth (MAALOX/MYLANTA) 200-200-20 MG/5ML suspension 30 mL  30 mL Oral Q4H PRN Trudy Carwin, NP       haloperidol (HALDOL) tablet 5 mg  5 mg Oral TID PRN Trudy Carwin, NP  And   diphenhydrAMINE (BENADRYL) capsule 50 mg  50 mg Oral TID PRN Trudy Carwin, NP       haloperidol lactate (HALDOL) injection 5 mg  5 mg Intramuscular TID PRN Trudy Carwin, NP       And   diphenhydrAMINE (BENADRYL) injection 50 mg  50 mg Intramuscular  TID PRN Trudy Carwin, NP       And   LORazepam  (ATIVAN ) injection 2 mg  2 mg Intramuscular TID PRN Trudy Carwin, NP       haloperidol lactate (HALDOL) injection 10 mg  10 mg Intramuscular TID PRN Trudy Carwin, NP       And   diphenhydrAMINE (BENADRYL) injection 50 mg  50 mg Intramuscular TID PRN Trudy Carwin, NP       And   LORazepam  (ATIVAN ) injection 2 mg  2 mg Intramuscular TID PRN Trudy Carwin, NP       dolutegravir-rilpivirine (JULUCA) 50-25 MG per tablet 1 tablet  1 tablet Oral Daily Dasie Ellouise CROME, FNP   1 tablet at 01/11/24 0908   feeding supplement (ENSURE PLUS HIGH PROTEIN) liquid 237 mL  237 mL Oral BID BM Cole Kandi BROCKS, MD   237 mL at 01/11/24 0908   gabapentin (NEURONTIN) capsule 200 mg  200 mg Oral TID Sabreen Kitchen, NP   200 mg at 01/11/24 9091   hydrOXYzine  (ATARAX ) tablet 50 mg  50 mg Oral TID PRN Tex Drilling, NP   50 mg at 01/11/24 9090   magnesium hydroxide (MILK OF MAGNESIA) suspension 30 mL  30 mL Oral Daily PRN Trudy Carwin, NP       menthol (CEPACOL) lozenge 6 mg  2 lozenge Oral TID PRN Bethea, Terrence C, MD   6 mg at 01/10/24 9094   multivitamin with minerals tablet 1 tablet  1 tablet Oral Daily Trudy Carwin, NP   1 tablet at 01/10/24 1236   phenol (CHLORASEPTIC) mouth spray 2 spray  2 spray Mouth/Throat TID PRN Cole Kandi BROCKS, MD   2 spray at 01/10/24 1650   propranolol (INDERAL) tablet 10 mg  10 mg Oral TID Bethea, Terrence C, MD   10 mg at 01/11/24 0908   QUEtiapine (SEROQUEL) tablet 50 mg  50 mg Oral QHS Bethea, Terrence C, MD   50 mg at 01/10/24 2105   Current Outpatient Medications  Medication Sig Dispense Refill   [START ON 01/12/2024] dolutegravir-rilpivirine (JULUCA) 50-25 MG tablet Take 1 tablet by mouth daily. 30 tablet 0   gabapentin (NEURONTIN) 100 MG capsule Take 3 capsules (300 mg total) by mouth 3 (three) times daily. 270 capsule 0   hydrOXYzine  (ATARAX ) 50 MG tablet Take 1 tablet (50 mg total) by mouth 3 (three) times daily as needed  for anxiety. 30 tablet 0   Multiple Vitamin (MULTIVITAMIN WITH MINERALS) TABS tablet Take 1 tablet by mouth daily.     propranolol (INDERAL) 10 MG tablet Take 1 tablet (10 mg total) by mouth 3 (three) times daily. 90 tablet 0   QUEtiapine (SEROQUEL) 50 MG tablet Take 1 tablet (50 mg total) by mouth at bedtime. 30 tablet 0    Labs  Lab Results:  Admission on 01/08/2024, Discharged on 01/11/2024  Component Date Value Ref Range Status   Sodium 01/10/2024 138  135 - 145 mmol/L Final   Potassium 01/10/2024 4.1  3.5 - 5.1 mmol/L Final   Chloride 01/10/2024 97 (L)  98 - 111 mmol/L Final   CO2 01/10/2024 27  22 - 32 mmol/L Final  Glucose, Bld 01/10/2024 54 (L)  70 - 99 mg/dL Final   Glucose reference range applies only to samples taken after fasting for at least 8 hours.   BUN 01/10/2024 <5 (L)  6 - 20 mg/dL Final   Creatinine, Ser 01/10/2024 1.03  0.61 - 1.24 mg/dL Final   Calcium 89/73/7974 11.1 (H)  8.9 - 10.3 mg/dL Final   GFR, Estimated 01/10/2024 >60  >60 mL/min Final   Comment: (NOTE) Calculated using the CKD-EPI Creatinine Equation (2021)    Anion gap 01/10/2024 14  5 - 15 Final   Performed at [facility], [address], [place], [place] [address]   Magnesium 01/10/2024 1.9  1.7 - 2.4 mg/dL Final   Performed at [facility], [address] [place], [place] [address]   Hgb A1c MFr Bld 01/10/2024 4.5 (L)  4.8 - 5.6 % Final   Comment: (NOTE) Diagnosis of Diabetes The following HbA1c ranges recommended by the American Diabetes Association (ADA) may be used as an aid in the diagnosis of diabetes mellitus.  Hemoglobin             Suggested A1C NGSP%              Diagnosis  <5.7                   Non Diabetic  5.7-6.4                Pre-Diabetic  >6.4                   Diabetic  <7.0                   Glycemic control for                       adults with diabetes.     Mean Plasma Glucose 01/10/2024 82.45  mg/dL Final   Performed at [facility],  [address], [place], [place] [address]   Vitamin B-12 01/10/2024 296  180 - 914 pg/mL Final   Comment: (NOTE) This assay is not validated for testing neonatal or myeloproliferative syndrome specimens for Vitamin B12 levels. Performed at [facility], [address], [place], [place] [address]   Admission on 01/07/2024, Discharged on 01/08/2024  Component Date Value Ref Range Status   Sodium 01/07/2024 140  135 - 145 mmol/L Final   Potassium 01/07/2024 3.4 (L)  3.5 - 5.1 mmol/L Final   Chloride 01/07/2024 104  98 - 111 mmol/L Final   CO2 01/07/2024 20 (L)  22 - 32 mmol/L Final   Glucose, Bld 01/07/2024 75  70 - 99 mg/dL Final   Glucose reference range applies only to samples taken after fasting for at least 8 hours.   BUN 01/07/2024 8  6 - 20 mg/dL Final   Creatinine, Ser 01/07/2024 0.87  0.61 - 1.24 mg/dL Final   Calcium 89/76/7974 9.0  8.9 - 10.3 mg/dL Final   Total Protein 89/76/7974 8.4 (H)  6.5 - 8.1 g/dL Final   Albumin 89/76/7974 4.5  3.5 - 5.0 g/dL Final   AST 89/76/7974 62 (H)  15 - 41 U/L Final   ALT 01/07/2024 39  0 - 44 U/L Final   Alkaline Phosphatase 01/07/2024 65  38 - 126 U/L Final   Total Bilirubin 01/07/2024 0.9  0.0 - 1.2 mg/dL Final   GFR, Estimated 01/07/2024 >60  >60 mL/min  Final   Comment: (NOTE) Calculated using the CKD-EPI Creatinine Equation (2021)    Anion gap 01/07/2024 16 (H)  5 - 15 Final   Performed at [facility], [address] [place], [place] [address]   Alcohol, Ethyl (B) 01/07/2024 153 (H)  <15 mg/dL Corrected   Comment: RESULT CALLED TO, READ BACK BY AND VERIFIED WITH: TOM NAGY AT 1744 ON 01/07/24 BY SS (NOTE) For medical purposes only. Performed at [facility], [address] [place], [place] [address] CORRECTED ON 10/23 AT 1750: PREVIOUSLY REPORTED AS <15    WBC 01/07/2024 7.4  4.0 - 10.5 K/uL Final   RBC 01/07/2024 5.12  4.22 - 5.81 MIL/uL Final   Hemoglobin 01/07/2024 15.9  13.0 - 17.0 g/dL Final   HCT  89/76/7974 46.8  39.0 - 52.0 % Final   MCV 01/07/2024 91.4  80.0 - 100.0 fL Final   MCH 01/07/2024 31.1  26.0 - 34.0 pg Final   MCHC 01/07/2024 34.0  30.0 - 36.0 g/dL Final   RDW 89/76/7974 13.2  11.5 - 15.5 % Final   Platelets 01/07/2024 169  150 - 400 K/uL Final   nRBC 01/07/2024 0.0  0.0 - 0.2 % Final   Performed at [facility], [address] [place], [place] [address]   Tricyclic, Ur Screen 01/07/2024 POSITIVE (A)  NONE DETECTED Final   Amphetamines, Ur Screen 01/07/2024 NONE DETECTED  NONE DETECTED Final   MDMA (Ecstasy)Ur Screen 01/07/2024 NONE DETECTED  NONE DETECTED Final   Cocaine Metabolite,Ur [place] 01/07/2024 NONE DETECTED  NONE DETECTED Final   Opiate, Ur Screen 01/07/2024 NONE DETECTED  NONE DETECTED Final   Phencyclidine (PCP) Ur S 01/07/2024 NONE DETECTED  NONE DETECTED Final   Cannabinoid 50 Ng, Ur [place] 01/07/2024 POSITIVE (A)  NONE DETECTED Final   Barbiturates, Ur Screen 01/07/2024 NONE DETECTED  NONE DETECTED Final   Benzodiazepine, Ur Scrn 01/07/2024 POSITIVE (A)  NONE DETECTED Final   Methadone Scn, Ur 01/07/2024 NONE DETECTED  NONE DETECTED Final   Comment: (NOTE) Tricyclics + metabolites, urine    Cutoff 1000 ng/mL Amphetamines + metabolites, urine  Cutoff 1000 ng/mL MDMA (Ecstasy), urine              Cutoff 500 ng/mL Cocaine Metabolite, urine          Cutoff 300 ng/mL Opiate + metabolites, urine        Cutoff 300 ng/mL Phencyclidine (PCP), urine         Cutoff 25 ng/mL Cannabinoid, urine                 Cutoff 50 ng/mL Barbiturates + metabolites, urine  Cutoff 200 ng/mL Benzodiazepine, urine              Cutoff 200 ng/mL Methadone, urine                   Cutoff 300 ng/mL  The urine drug screen provides only a preliminary, unconfirmed analytical test result and should not be used for non-medical purposes. Clinical consideration and professional judgment should be applied to any positive drug screen result due to possible interfering substances. A more  specific alternate chemical method must be used in order to obtain a confirmed analytical result. Gas chromatography / mass spectrometry (GC/MS) is the preferred confirm                          atory method. Performed at [facility],  [address], [place], [place] [address]    Magnesium 01/07/2024 1.5 (L)  1.7 - 2.4 mg/dL Final   Performed at [facility], [address] [place], [place] [address]   Acetaminophen  (Tylenol ), Serum 01/07/2024 <10 (L)  10 - 30 ug/mL Final   Comment: (NOTE) Therapeutic concentrations vary significantly. A range of 10-30 ug/mL  may be an effective concentration for many patients. However, some  are best treated at concentrations outside of this range. Acetaminophen  concentrations >150 ug/mL at 4 hours after ingestion  and >50 ug/mL at 12 hours after ingestion are often associated with  toxic reactions.  Performed at [facility], [address] [place], [place] [address]    Salicylate Lvl 01/07/2024 <7.0 (L)  7.0 - 30.0 mg/dL Final   Performed at [facility], [address] [place], [place] [address]   Glucose-Capillary 01/07/2024 62 (L)  70 - 99 mg/dL Final   Glucose reference range applies only to samples taken after fasting for at least 8 hours.   Glucose-Capillary 01/07/2024 102 (H)  70 - 99 mg/dL Final   Glucose reference range applies only to samples taken after fasting for at least 8 hours.    Blood Alcohol level:  Lab Results  Component Value Date   ETH 153 (H) 01/07/2024   ETH <10 09/28/2022    Metabolic Disorder Labs: Lab Results  Component Value Date   HGBA1C 4.5 (L) 01/10/2024   MPG 82.45 01/10/2024   No results found for: PROLACTIN No results found for: CHOL, TRIG, HDL, CHOLHDL, VLDL, LDLCALC  Therapeutic Lab Levels: No results found for: LITHIUM No results found for: VALPROATE No results found for: CBMZ  Physical Findings   GAD-7    Flowsheet Row Office  Visit from 06/02/2023 in [facility]  Total GAD-7 Score 12   PHQ2-9    Flowsheet Row ED from 01/08/2024 in [facility] Office Visit from 06/02/2023 in [facility]  PHQ-2 Total Score 2 2  PHQ-9 Total Score 9 10   Flowsheet Row ED from 01/08/2024 in [facility] ED from 01/07/2024 in [facility] Emergency Department at [facility] ED from 09/28/2022 in [facility] Emergency Department at [facility]  C-SSRS RISK CATEGORY Error: Q3, 4, or 5 should not be populated when Q2 is No No Risk No Risk     Musculoskeletal  Strength & Muscle Tone: within normal limits Gait & Station: normal Patient leans: N/A  Psychiatric Specialty Exam  Presentation  General Appearance:  Casual  Eye Contact: Fair  Speech: Clear and Coherent  Speech Volume: Normal  Handedness: Right   Mood and Affect  Mood: Euthymic  Affect: Congruent   Thought Process  Thought Processes: Coherent  Descriptions of Associations:Intact  Orientation:Full (Time, Place and Person)  Thought Content:Logical     Hallucinations:Hallucinations: None  Ideas of Reference:None  Suicidal Thoughts:Suicidal Thoughts: No  Homicidal Thoughts:Homicidal Thoughts: No   Sensorium  Memory: Immediate Fair  Judgment: Fair  Insight: Good   Executive Functions  Concentration: Fair  Attention Span: Good  Recall: Good  Fund of Knowledge: Good  Language: Good   Psychomotor Activity  Psychomotor Activity: Psychomotor Activity: Normal   Assets  Assets: Resilience   Sleep  Sleep: Sleep: Good  Estimated Sleeping Duration (Last 24 Hours): 8.25-10.00 hours  Nutritional Assessment (For OBS and FBC admissions only) Has the patient had a weight loss or gain of 10 pounds or more in the last 3  months?: No Has the patient had a decrease in food intake/or appetite?: No Does the patient have dental  problems?: No Does the patient have eating habits or behaviors that may be indicators of an eating disorder including binging or inducing vomiting?: No Has the patient recently lost weight without trying?: 0 Has the patient been eating poorly because of a decreased appetite?: 0 Malnutrition Screening Tool Score: 0    Physical Exam  Physical Exam Vitals and nursing note reviewed.  Neurological:     General: No focal deficit present.     Mental Status: He is alert and oriented to person, place, and time.     Motor: Motor function is intact. No tremor.     Gait: Gait is intact.  Psychiatric:        Attention and Perception: Attention normal. He does not perceive auditory or visual hallucinations.        Mood and Affect: Mood is anxious. Mood is not depressed.        Speech: Speech normal.        Behavior: Behavior normal. Behavior is cooperative.        Thought Content: Thought content normal. Thought content is not delusional. Thought content does not include homicidal or suicidal ideation. Thought content does not include homicidal or suicidal plan.        Cognition and Memory: Cognition normal.        Judgment: Judgment normal. Judgment is not inappropriate.    Review of Systems  Psychiatric/Behavioral:  Positive for substance abuse. Negative for depression, hallucinations, memory loss and suicidal ideas. The patient is nervous/anxious (stable) and has insomnia (stable).   All other systems reviewed and are negative.  Blood pressure (!) 141/98, pulse 85, temperature 97.7 F (36.5 C), temperature source Oral, resp. rate 19, SpO2 98%. There is no height or weight on file to calculate BMI.  Treatment Plan Summary: Pt has been accepted to [facility] and scheduled for pick-up on Monday, 01/11/24, bu 11:00 am. 7-day prescriptions will be provided. Pt is stable for outpatient management.  Donia Snell, NP 01/11/2024 1:19 PM

## 2024-01-11 NOTE — Progress Notes (Signed)
 Patient is currently resting at this time with no acute distress noted.  Respirations present and unlabored.  No current issues noted at this time.

## 2024-01-11 NOTE — Progress Notes (Signed)
 Patient is currently resting at this time.  No acute distress noted.  Respirations present and unlabored.  No current issues noted at this time.

## 2024-01-11 NOTE — ED Notes (Signed)
 Pt attended AA group until his ride came

## 2024-01-11 NOTE — ED Notes (Signed)
 At the time of discharge, the patient was alert and oriented 3, No current suicidal or homicidal ideation, plan, or intent was reported.  No signs of acute distress were noted. The patient was medically cleared for discharge.[facility]. Discharge instructions were reviewed with patient Transportation picked up by driver from [facility]. Patient verbalized understanding of discharge instructions and was provided a copy. All questions were addressed prior to discharge. Pt left stable

## 2024-03-10 ENCOUNTER — Other Ambulatory Visit: Payer: Self-pay

## 2024-03-10 ENCOUNTER — Emergency Department
Admission: EM | Admit: 2024-03-10 | Discharge: 2024-03-10 | Disposition: A | Discharge status: Home / Self Care | Payer: MEDICAID | Attending: Emergency Medicine | Admitting: Emergency Medicine

## 2024-03-10 DIAGNOSIS — F1010 Alcohol abuse, uncomplicated: Secondary | ICD-10-CM | POA: Insufficient documentation

## 2024-03-10 DIAGNOSIS — Z21 Asymptomatic human immunodeficiency virus [HIV] infection status: Secondary | ICD-10-CM | POA: Insufficient documentation

## 2024-03-10 DIAGNOSIS — J45909 Unspecified asthma, uncomplicated: Secondary | ICD-10-CM | POA: Insufficient documentation

## 2024-03-10 DIAGNOSIS — F1090 Alcohol use, unspecified, uncomplicated: Secondary | ICD-10-CM

## 2024-03-10 MED ORDER — CHLORDIAZEPOXIDE HCL 25 MG PO CAPS: 25.0000 mg | ORAL_CAPSULE | ORAL | 0 refills | Status: AC

## 2024-03-10 MED ORDER — ONDANSETRON 4 MG PO TBDP: 4.0000 mg | ORAL_TABLET | Freq: Four times a day (QID) | ORAL | 0 refills | Status: AC | PRN

## 2024-03-10 MED ORDER — THIAMINE HCL 100 MG PO TABS: 100.0000 mg | ORAL_TABLET | Freq: Every day | ORAL | 3 refills | Status: AC

## 2024-03-10 MED ORDER — FOLIC ACID 1 MG PO TABS: 1.0000 mg | ORAL_TABLET | Freq: Every day | ORAL | 3 refills | Status: AC

## 2024-03-10 NOTE — ED Provider Notes (Signed)
 "  [facility] Provider Note    Event Date/Time   First MD Initiated Contact with Patient 03/10/24 [phone]     (approximate)   History   Alcohol Problem   HPI  Collin Jones is a 28 y.o. male with history of HIV, anxiety, asthma, alcohol use disorder who presents to the emergency department requesting detox.  States he wants help with alcohol and benzodiazepines.  Denies SI, HI, hallucinations.  States he has been drinking and taking Xanax .   History provided by patient.    Past Medical History:  Diagnosis Date   Anxiety    Asthma    HIV (human immunodeficiency virus infection) (HCC)    IBS (irritable bowel syndrome)     History reviewed. No pertinent surgical history.  MEDICATIONS:  Prior to Admission medications  Medication Sig Start Date End Date Taking? Authorizing Provider  dolutegravir -rilpivirine  (JULUCA ) 50-25 MG tablet Take 1 tablet by mouth daily. 01/12/24   Tex Drilling, NP  gabapentin  (NEURONTIN ) 100 MG capsule Take 3 capsules (300 mg total) by mouth 3 (three) times daily. 01/11/24   Tex Drilling, NP  hydrOXYzine  (ATARAX ) 50 MG tablet Take 1 tablet (50 mg total) by mouth 3 (three) times daily as needed for anxiety. 01/11/24   Tex Drilling, NP  Multiple Vitamin (MULTIVITAMIN WITH MINERALS) TABS tablet Take 1 tablet by mouth daily. 01/11/24   Tex Drilling, NP  propranolol  (INDERAL ) 10 MG tablet Take 1 tablet (10 mg total) by mouth 3 (three) times daily. 01/11/24   Tex Drilling, NP  QUEtiapine  (SEROQUEL ) 50 MG tablet Take 1 tablet (50 mg total) by mouth at bedtime. 01/11/24   Tex Drilling, NP    Physical Exam   Triage Vital Signs: ED Triage Vitals  Encounter Vitals Group     BP 03/10/24 0301 (!) 126/103     Girls Systolic BP Percentile --      Girls Diastolic BP Percentile --      Boys Systolic BP Percentile --      Boys Diastolic BP Percentile --      Pulse Rate 03/10/24 0301 (!) 110     Resp 03/10/24 0301 20     Temp  03/10/24 0301 98.6 F (37 C)     Temp Source 03/10/24 0301 Oral     SpO2 03/10/24 0301 99 %     Weight --      Height --      Head Circumference --      Peak Flow --      Pain Score 03/10/24 0305 10     Pain Loc --      Pain Education --      Exclude from Growth Chart --     Most recent vital signs: Vitals:   03/10/24 0301  BP: (!) 126/103  Pulse: (!) 110  Resp: 20  Temp: 98.6 F (37 C)  SpO2: 99%    CONSTITUTIONAL: Alert, responds appropriately to questions. Well-appearing; well-nourished, smiling, laughing HEAD: Normocephalic, atraumatic EYES: Conjunctivae clear, pupils appear equal, sclera nonicteric ENT: normal nose; moist mucous membranes NECK: Supple, normal ROM CARD: Regular and tachycardic; S1 and S2 appreciated RESP: Normal chest excursion without splinting or tachypnea; breath sounds clear and equal bilaterally; no wheezes, no rhonchi, no rales, no hypoxia or respiratory distress, speaking full sentences ABD/GI: Non-distended; soft, non-tender, no rebound, no guarding, no peritoneal signs BACK: The back appears normal EXT: Normal ROM in all joints; no deformity noted, no edema SKIN: Normal color for age  and race; warm; no rash on exposed skin NEURO: Moves all extremities equally, normal speech, ambulates with steady gait PSYCH: The patient's mood and manner are appropriate.  Denies SI or HI.   ED Results / Procedures / Treatments   LABS: (all labs ordered are listed, but only abnormal results are displayed) Labs Reviewed - No data to display   EKG:   RADIOLOGY: My personal review and interpretation of imaging:    I have personally reviewed all radiology reports.   No results found.   PROCEDURES:  Critical Care performed: No      Procedures    IMPRESSION / MDM / ASSESSMENT AND PLAN / ED COURSE  I reviewed the triage vital signs and the nursing notes.    Patient here with history of alcohol use disorder, benzodiazepine misuse who  presents to the emergency department with complaints of wanting help with substance use disorder.     DIFFERENTIAL DIAGNOSIS (includes but not limited to):   Substance use disorder, intoxication, anxiety   Patient's presentation is most consistent with acute presentation with potential threat to life or bodily function.   PLAN: No sign of active withdrawal.  He is not tremulous.  He has a steady gait.  Was tachycardic but appears slightly intoxicated and anxious although this has improved.  Patient is now requesting discharge.  Offered Librium  taper which he agrees to and will discharge with thiamine , folic acid  and outpatient resources.  He has no SI, HI or hallucinations.  No reason for emergent psychiatric evaluation today.    MEDICATIONS GIVEN IN ED: Medications - No data to display   ED COURSE:  At this time, I do not feel there is any life-threatening condition present. I reviewed all nursing notes, vitals, pertinent previous records.  All lab and urine results, EKGs, imaging ordered have been independently reviewed and interpreted by myself.  I reviewed all available radiology reports from any imaging ordered this visit.  Based on my assessment, I feel the patient is safe to be discharged home without further emergent workup and can continue workup as an outpatient as needed. Discussed all findings, treatment plan as well as usual and customary return precautions.  They verbalize understanding and are comfortable with this plan.  Outpatient follow-up has been provided as needed.  All questions have been answered.    CONSULTS:  none   OUTSIDE RECORDS REVIEWED: Reviewed recent psychiatric notes.       FINAL CLINICAL IMPRESSION(S) / ED DIAGNOSES   Final diagnoses:  Alcohol use disorder     Rx / DC Orders   ED Discharge Orders          Ordered    chlordiazePOXIDE  (LIBRIUM ) 25 MG capsule  As directed        03/10/24 0353    ondansetron  (ZOFRAN -ODT) 4 MG  disintegrating tablet  Every 6 hours PRN        03/10/24 0353    thiamine  (VITAMIN B1) 100 MG tablet  Daily        03/10/24 0355    folic acid  (FOLVITE ) 1 MG tablet  Daily        03/10/24 0355             Note:  This document was prepared using Dragon voice recognition software and may include unintentional dictation errors.   Ura Yingling, Josette SAILOR, DO 03/10/24 0522  "

## 2024-03-10 NOTE — ED Triage Notes (Signed)
 Pt arrives reporting alcohol and benzo withdrawal. Last benzo was taken yesterday and last drink was few hours pta. Pt very anxious. Denies SI/HI.
